# Patient Record
Sex: Female | Born: 1950 | Race: Black or African American | Hispanic: No | Marital: Married | Smoking: Never smoker
Health system: Southern US, Community
[De-identification: ages and names within clinical notes are randomized; demographics above are authoritative.]

## PROBLEM LIST (undated history)

## (undated) DIAGNOSIS — E785 Hyperlipidemia, unspecified: Secondary | ICD-10-CM

## (undated) DIAGNOSIS — C801 Malignant (primary) neoplasm, unspecified: Secondary | ICD-10-CM

## (undated) DIAGNOSIS — Z5189 Encounter for other specified aftercare: Secondary | ICD-10-CM

## (undated) DIAGNOSIS — D649 Anemia, unspecified: Secondary | ICD-10-CM

## (undated) DIAGNOSIS — I10 Essential (primary) hypertension: Secondary | ICD-10-CM

## (undated) DIAGNOSIS — T7840XA Allergy, unspecified, initial encounter: Secondary | ICD-10-CM

## (undated) HISTORY — DX: Malignant (primary) neoplasm, unspecified: C80.1

## (undated) HISTORY — DX: Hyperlipidemia, unspecified: E78.5

## (undated) HISTORY — DX: Allergy, unspecified, initial encounter: T78.40XA

## (undated) HISTORY — DX: Encounter for other specified aftercare: Z51.89

## (undated) HISTORY — DX: Anemia, unspecified: D64.9

## (undated) HISTORY — PX: ROTATOR CUFF REPAIR: SHX139

## (undated) HISTORY — PX: COLONOSCOPY: SHX174

---

## 1998-05-10 ENCOUNTER — Other Ambulatory Visit: Admission: RE | Admit: 1998-05-10 | Discharge: 1998-05-10 | Payer: Self-pay | Admitting: Obstetrics & Gynecology

## 2000-07-16 ENCOUNTER — Ambulatory Visit (HOSPITAL_COMMUNITY): Admission: RE | Admit: 2000-07-16 | Discharge: 2000-07-16 | Payer: Self-pay | Admitting: Obstetrics & Gynecology

## 2000-07-16 ENCOUNTER — Encounter: Payer: Self-pay | Admitting: Obstetrics & Gynecology

## 2002-02-12 ENCOUNTER — Encounter: Payer: Self-pay | Admitting: Obstetrics & Gynecology

## 2002-02-12 ENCOUNTER — Ambulatory Visit (HOSPITAL_COMMUNITY): Admission: RE | Admit: 2002-02-12 | Discharge: 2002-02-12 | Payer: Self-pay | Admitting: Obstetrics & Gynecology

## 2003-02-16 ENCOUNTER — Ambulatory Visit (HOSPITAL_COMMUNITY): Admission: RE | Admit: 2003-02-16 | Discharge: 2003-02-16 | Payer: Self-pay | Admitting: Obstetrics & Gynecology

## 2004-03-15 ENCOUNTER — Ambulatory Visit (HOSPITAL_COMMUNITY): Admission: RE | Admit: 2004-03-15 | Discharge: 2004-03-15 | Payer: Self-pay | Admitting: Internal Medicine

## 2005-03-28 ENCOUNTER — Encounter: Admission: RE | Admit: 2005-03-28 | Discharge: 2005-03-28 | Payer: Self-pay | Admitting: Internal Medicine

## 2006-02-15 ENCOUNTER — Emergency Department (HOSPITAL_COMMUNITY): Admission: EM | Admit: 2006-02-15 | Discharge: 2006-02-15 | Payer: Self-pay | Admitting: Emergency Medicine

## 2006-04-05 ENCOUNTER — Encounter
Admission: RE | Admit: 2006-04-05 | Discharge: 2006-04-05 | Payer: Self-pay | Admitting: Physical Medicine and Rehabilitation

## 2007-04-09 ENCOUNTER — Encounter: Admission: RE | Admit: 2007-04-09 | Discharge: 2007-04-09 | Payer: Self-pay | Admitting: Internal Medicine

## 2007-10-17 ENCOUNTER — Ambulatory Visit: Payer: Self-pay | Admitting: Internal Medicine

## 2007-10-24 ENCOUNTER — Telehealth: Payer: Self-pay | Admitting: Internal Medicine

## 2007-10-30 ENCOUNTER — Ambulatory Visit: Payer: Self-pay | Admitting: Internal Medicine

## 2008-05-11 ENCOUNTER — Encounter: Admission: RE | Admit: 2008-05-11 | Discharge: 2008-05-11 | Payer: Self-pay | Admitting: Internal Medicine

## 2009-03-12 DIAGNOSIS — C801 Malignant (primary) neoplasm, unspecified: Secondary | ICD-10-CM

## 2009-03-12 DIAGNOSIS — Z5189 Encounter for other specified aftercare: Secondary | ICD-10-CM

## 2009-03-12 HISTORY — DX: Malignant (primary) neoplasm, unspecified: C80.1

## 2009-03-12 HISTORY — DX: Encounter for other specified aftercare: Z51.89

## 2009-03-12 HISTORY — PX: ABDOMINAL HYSTERECTOMY: SHX81

## 2009-05-18 ENCOUNTER — Encounter: Admission: RE | Admit: 2009-05-18 | Discharge: 2009-05-18 | Payer: Self-pay | Admitting: Obstetrics and Gynecology

## 2009-06-03 ENCOUNTER — Ambulatory Visit: Admission: RE | Admit: 2009-06-03 | Discharge: 2009-06-03 | Payer: Self-pay | Admitting: Gynecology

## 2009-06-07 ENCOUNTER — Encounter: Payer: Self-pay | Admitting: Obstetrics & Gynecology

## 2009-06-07 ENCOUNTER — Inpatient Hospital Stay (HOSPITAL_COMMUNITY): Admission: RE | Admit: 2009-06-07 | Discharge: 2009-06-10 | Payer: Self-pay | Admitting: Gynecology

## 2009-06-15 ENCOUNTER — Ambulatory Visit: Admission: RE | Admit: 2009-06-15 | Discharge: 2009-06-15 | Payer: Self-pay | Admitting: Gynecology

## 2009-07-29 ENCOUNTER — Ambulatory Visit: Admission: RE | Admit: 2009-07-29 | Discharge: 2009-07-29 | Payer: Self-pay | Admitting: Gynecology

## 2010-04-01 ENCOUNTER — Encounter: Payer: Self-pay | Admitting: Obstetrics & Gynecology

## 2010-04-12 ENCOUNTER — Other Ambulatory Visit: Payer: Self-pay | Admitting: Obstetrics & Gynecology

## 2010-04-12 DIAGNOSIS — Z1239 Encounter for other screening for malignant neoplasm of breast: Secondary | ICD-10-CM

## 2010-05-22 ENCOUNTER — Ambulatory Visit
Admission: RE | Admit: 2010-05-22 | Discharge: 2010-05-22 | Disposition: A | Discharge status: Home / Self Care | Payer: Self-pay | Source: Ambulatory Visit | Attending: Obstetrics & Gynecology | Admitting: Obstetrics & Gynecology

## 2010-05-22 DIAGNOSIS — Z1239 Encounter for other screening for malignant neoplasm of breast: Secondary | ICD-10-CM

## 2010-06-05 LAB — DIFFERENTIAL
Eosinophils Absolute: 0 10*3/uL (ref 0.0–0.7)
Eosinophils Relative: 1 % (ref 0–5)
Monocytes Absolute: 0.3 10*3/uL (ref 0.1–1.0)
Monocytes Relative: 8 % (ref 3–12)
Neutrophils Relative %: 46 % (ref 43–77)

## 2010-06-05 LAB — URINALYSIS, ROUTINE W REFLEX MICROSCOPIC
Nitrite: NEGATIVE
Urobilinogen, UA: 0.2 mg/dL (ref 0.0–1.0)

## 2010-06-05 LAB — BASIC METABOLIC PANEL
Calcium: 8 mg/dL — ABNORMAL LOW (ref 8.4–10.5)
GFR calc Af Amer: >60 mL/min (ref >60)
Potassium: 4.1 mEq/L (ref 3.5–5.1)

## 2010-06-05 LAB — COMPREHENSIVE METABOLIC PANEL
Alkaline Phosphatase: 70 U/L (ref 39–117)
BUN: 13 mg/dL (ref 6–23)
Chloride: 105 mEq/L (ref 96–112)
Creatinine, Ser: 0.92 mg/dL (ref 0.4–1.2)
GFR calc Af Amer: >60 mL/min (ref >60)
Sodium: 143 mEq/L (ref 135–145)
Total Bilirubin: 0.5 mg/dL (ref 0.3–1.2)

## 2010-06-05 LAB — CBC
HCT: 35.2 % — ABNORMAL LOW (ref 36.0–46.0)
Hemoglobin: 11.3 g/dL — ABNORMAL LOW (ref 12.0–15.0)
MCHC: 32.1 g/dL (ref 30.0–36.0)
MCHC: 32.9 g/dL (ref 30.0–36.0)
Platelets: 171 10*3/uL (ref 150–400)
RBC: 4.06 MIL/uL (ref 3.87–5.11)
RDW: 12.2 % (ref 11.5–15.5)
RDW: 12.7 % (ref 11.5–15.5)
WBC: 4.2 10*3/uL (ref 4.0–10.5)
WBC: 7.7 10*3/uL (ref 4.0–10.5)

## 2010-06-05 LAB — URINE MICROSCOPIC-ADD ON

## 2010-06-05 LAB — URINE CULTURE: Colony Count: 85000

## 2010-06-05 LAB — TYPE AND SCREEN: Antibody Screen: NEGATIVE

## 2010-11-26 ENCOUNTER — Emergency Department (HOSPITAL_COMMUNITY)
Admission: EM | Admit: 2010-11-26 | Discharge: 2010-11-26 | Disposition: A | Discharge status: Home / Self Care | Payer: BC Managed Care – PPO | Attending: Emergency Medicine | Admitting: Emergency Medicine

## 2010-11-26 DIAGNOSIS — I10 Essential (primary) hypertension: Secondary | ICD-10-CM | POA: Insufficient documentation

## 2010-11-26 DIAGNOSIS — T63481A Toxic effect of venom of other arthropod, accidental (unintentional), initial encounter: Secondary | ICD-10-CM | POA: Insufficient documentation

## 2010-11-26 DIAGNOSIS — T6391XA Toxic effect of contact with unspecified venomous animal, accidental (unintentional), initial encounter: Secondary | ICD-10-CM | POA: Insufficient documentation

## 2010-11-26 DIAGNOSIS — L989 Disorder of the skin and subcutaneous tissue, unspecified: Secondary | ICD-10-CM | POA: Insufficient documentation

## 2011-01-09 ENCOUNTER — Other Ambulatory Visit: Payer: Self-pay | Admitting: Obstetrics & Gynecology

## 2011-04-06 ENCOUNTER — Emergency Department (HOSPITAL_COMMUNITY)
Admission: EM | Admit: 2011-04-06 | Discharge: 2011-04-06 | Disposition: A | Discharge status: Home / Self Care | Payer: No Typology Code available for payment source | Attending: Emergency Medicine | Admitting: Emergency Medicine

## 2011-04-06 ENCOUNTER — Encounter (HOSPITAL_COMMUNITY): Payer: Self-pay | Admitting: Emergency Medicine

## 2011-04-06 ENCOUNTER — Emergency Department (HOSPITAL_COMMUNITY): Payer: No Typology Code available for payment source

## 2011-04-06 DIAGNOSIS — S161XXA Strain of muscle, fascia and tendon at neck level, initial encounter: Secondary | ICD-10-CM

## 2011-04-06 DIAGNOSIS — I10 Essential (primary) hypertension: Secondary | ICD-10-CM | POA: Insufficient documentation

## 2011-04-06 DIAGNOSIS — S139XXA Sprain of joints and ligaments of unspecified parts of neck, initial encounter: Secondary | ICD-10-CM | POA: Insufficient documentation

## 2011-04-06 DIAGNOSIS — M62838 Other muscle spasm: Secondary | ICD-10-CM | POA: Insufficient documentation

## 2011-04-06 DIAGNOSIS — R51 Headache: Secondary | ICD-10-CM | POA: Insufficient documentation

## 2011-04-06 DIAGNOSIS — M542 Cervicalgia: Secondary | ICD-10-CM | POA: Insufficient documentation

## 2011-04-06 HISTORY — DX: Essential (primary) hypertension: I10

## 2011-04-06 MED ORDER — FENTANYL CITRATE 0.05 MG/ML IJ SOLN
100.0000 ug | Freq: Once | INTRAMUSCULAR | Status: DC
  Filled 2011-04-06: qty 2

## 2011-04-06 MED ORDER — IBUPROFEN 800 MG PO TABS: 800.0000 mg | ORAL_TABLET | Freq: Three times a day (TID) | ORAL | Status: AC

## 2011-04-06 MED ORDER — HYDROCODONE-ACETAMINOPHEN 5-500 MG PO TABS: 1.0000 | ORAL_TABLET | Freq: Four times a day (QID) | ORAL | Status: AC | PRN

## 2011-04-06 MED ORDER — METHOCARBAMOL 500 MG PO TABS: 500.0000 mg | ORAL_TABLET | Freq: Two times a day (BID) | ORAL | Status: AC

## 2011-04-06 MED ORDER — ONDANSETRON 4 MG PO TBDP
4.0000 mg | ORAL_TABLET | Freq: Once | ORAL | Status: DC
  Filled 2011-04-06: qty 1

## 2011-04-06 MED ORDER — IBUPROFEN 800 MG PO TABS
800.0000 mg | ORAL_TABLET | Freq: Once | ORAL | Status: AC
  Administered 2011-04-06: 800 mg via ORAL
  Filled 2011-04-06: qty 1

## 2011-04-06 MED ORDER — OXYCODONE-ACETAMINOPHEN 5-325 MG PO TABS
1.0000 | ORAL_TABLET | Freq: Once | ORAL | Status: AC
  Administered 2011-04-06: 1 via ORAL
  Filled 2011-04-06: qty 1
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
## 2011-04-06 NOTE — ED Notes (Signed)
Patient transported to X-ray 
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
## 2011-04-09 NOTE — ED Provider Notes (Signed)
Medical screening examination/treatment/procedure(s) were performed by non-physician practitioner and as supervising physician I was immediately available for consultation/collaboration.  Nicholes Stairs, MD 04/09/11 [phone]

## 2011-04-25 ENCOUNTER — Other Ambulatory Visit: Payer: Self-pay | Admitting: Obstetrics & Gynecology

## 2011-04-25 DIAGNOSIS — Z1231 Encounter for screening mammogram for malignant neoplasm of breast: Secondary | ICD-10-CM

## 2011-05-23 ENCOUNTER — Ambulatory Visit
Admission: RE | Admit: 2011-05-23 | Discharge: 2011-05-23 | Disposition: A | Discharge status: Home / Self Care | Payer: BC Managed Care – PPO | Source: Ambulatory Visit | Attending: Obstetrics & Gynecology | Admitting: Obstetrics & Gynecology

## 2011-05-23 DIAGNOSIS — Z1231 Encounter for screening mammogram for malignant neoplasm of breast: Secondary | ICD-10-CM

## 2013-06-02 ENCOUNTER — Other Ambulatory Visit: Payer: Self-pay | Admitting: Obstetrics & Gynecology

## 2013-07-06 ENCOUNTER — Other Ambulatory Visit: Payer: Self-pay | Admitting: *Deleted

## 2013-07-06 DIAGNOSIS — M7989 Other specified soft tissue disorders: Secondary | ICD-10-CM

## 2013-07-22 ENCOUNTER — Encounter: Payer: Self-pay | Admitting: Vascular Surgery

## 2013-07-23 ENCOUNTER — Encounter: Payer: Self-pay | Admitting: Vascular Surgery

## 2013-07-23 ENCOUNTER — Ambulatory Visit (INDEPENDENT_AMBULATORY_CARE_PROVIDER_SITE_OTHER): Payer: BC Managed Care – HMO | Admitting: Vascular Surgery

## 2013-07-23 ENCOUNTER — Ambulatory Visit (HOSPITAL_COMMUNITY)
Admission: RE | Admit: 2013-07-23 | Discharge: 2013-07-23 | Disposition: A | Discharge status: Home / Self Care | Payer: BC Managed Care – HMO | Source: Ambulatory Visit | Attending: Vascular Surgery | Admitting: Vascular Surgery

## 2013-07-23 VITALS — BP 152/89 | HR 64 | Ht 59.0 in | Wt 140.6 lb

## 2013-07-23 DIAGNOSIS — M7989 Other specified soft tissue disorders: Secondary | ICD-10-CM
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
## 2014-05-31 ENCOUNTER — Other Ambulatory Visit: Payer: Self-pay | Admitting: Internal Medicine

## 2014-05-31 DIAGNOSIS — Z1231 Encounter for screening mammogram for malignant neoplasm of breast: Secondary | ICD-10-CM

## 2014-06-08 ENCOUNTER — Ambulatory Visit: Payer: Medicare Other

## 2014-10-05 ENCOUNTER — Encounter: Payer: Self-pay | Admitting: Internal Medicine

## 2015-02-07 ENCOUNTER — Ambulatory Visit: Payer: Commercial Managed Care - HMO | Admitting: Podiatry

## 2015-06-16 ENCOUNTER — Other Ambulatory Visit: Payer: Self-pay

## 2015-06-16 DIAGNOSIS — Z1231 Encounter for screening mammogram for malignant neoplasm of breast: Secondary | ICD-10-CM

## 2015-07-07 ENCOUNTER — Ambulatory Visit: Payer: Medicare Other

## 2015-07-18 ENCOUNTER — Other Ambulatory Visit: Payer: Self-pay | Admitting: Obstetrics & Gynecology

## 2015-07-19 LAB — CYTOLOGY - PAP

## 2015-07-25 ENCOUNTER — Ambulatory Visit
Admission: RE | Admit: 2015-07-25 | Discharge: 2015-07-25 | Disposition: A | Discharge status: Home / Self Care | Payer: Medicare Other | Source: Ambulatory Visit

## 2015-07-25 DIAGNOSIS — Z1231 Encounter for screening mammogram for malignant neoplasm of breast: Secondary | ICD-10-CM

## 2016-06-12 ENCOUNTER — Other Ambulatory Visit: Payer: Self-pay | Admitting: Internal Medicine

## 2016-06-12 DIAGNOSIS — Z1231 Encounter for screening mammogram for malignant neoplasm of breast: Secondary | ICD-10-CM

## 2016-07-25 ENCOUNTER — Ambulatory Visit
Admission: RE | Admit: 2016-07-25 | Discharge: 2016-07-25 | Disposition: A | Discharge status: Home / Self Care | Payer: Medicare Other | Source: Ambulatory Visit | Attending: Internal Medicine | Admitting: Internal Medicine

## 2016-07-25 DIAGNOSIS — Z1231 Encounter for screening mammogram for malignant neoplasm of breast: Secondary | ICD-10-CM

## 2017-10-17 ENCOUNTER — Encounter: Payer: Self-pay | Admitting: Gastroenterology

## 2017-10-23 ENCOUNTER — Encounter: Payer: Self-pay | Admitting: Gastroenterology

## 2017-12-05 ENCOUNTER — Encounter: Payer: Self-pay | Admitting: Gastroenterology

## 2017-12-05 ENCOUNTER — Ambulatory Visit (AMBULATORY_SURGERY_CENTER): Payer: Self-pay

## 2017-12-05 VITALS — Ht 59.0 in | Wt 145.6 lb

## 2017-12-05 DIAGNOSIS — Z1211 Encounter for screening for malignant neoplasm of colon: Secondary | ICD-10-CM

## 2017-12-05 MED ORDER — PEG-KCL-NACL-NASULF-NA ASC-C 140 G PO SOLR: 1.0000 | Freq: Once | ORAL | Status: AC

## 2017-12-05 NOTE — Progress Notes (Signed)
Per pt, no allergies to soy or egg products.Pt not taking any weight loss meds or using  O2 at home.  Pt refused emmi video. 

## 2017-12-19 ENCOUNTER — Ambulatory Visit (AMBULATORY_SURGERY_CENTER): Payer: Medicare Other | Admitting: Gastroenterology

## 2017-12-19 ENCOUNTER — Encounter: Payer: Self-pay | Admitting: Gastroenterology

## 2017-12-19 VITALS — BP 144/75 | HR 62 | Temp 98.9°F | Resp 16 | Ht 59.0 in | Wt 145.0 lb

## 2017-12-19 DIAGNOSIS — Z1211 Encounter for screening for malignant neoplasm of colon: Secondary | ICD-10-CM

## 2017-12-19 MED ORDER — SODIUM CHLORIDE 0.9 % IV SOLN: 500.0000 mL | Freq: Once | INTRAVENOUS | Status: DC

## 2017-12-19 NOTE — Progress Notes (Signed)
Report given to PACU, vss 

## 2017-12-19 NOTE — Progress Notes (Signed)
Pt's states no medical or surgical changes since previsit or office visit. 

## 2017-12-19 NOTE — Op Note (Addendum)
[place] Patient Name: Emily Adkins Procedure Date: 12/19/2017 11:01 AM MRN: [id number] Endoscopist: Mallie Mussel L. Loletha Carrow , MD Age: 67 Referring MD:  Date of Birth: Jul 28, 1950 Gender: Female Account #: [id number] Procedure:                Colonoscopy Indications:              Screening for colorectal malignant neoplasm (normal                            colonoscopy 10/2007) Medicines:                Monitored Anesthesia Care Procedure:                Pre-Anesthesia Assessment:                           - Prior to the procedure, a History and Physical                            was performed, and patient medications and                            allergies were reviewed. The patient's tolerance of                            previous anesthesia was also reviewed. The risks                            and benefits of the procedure and the sedation                            options and risks were discussed with the patient.                            All questions were answered, and informed consent                            was obtained. Prior Anticoagulants: The patient has                            taken no previous anticoagulant or antiplatelet                            agents. ASA Grade Assessment: II - A patient with                            mild systemic disease. After reviewing the risks                            and benefits, the patient was deemed in                            satisfactory condition to undergo the procedure.  After obtaining informed consent, the colonoscope                            was passed under direct vision. Throughout the                            procedure, the patient's blood pressure, pulse, and                            oxygen saturations were monitored continuously. The                            Colonoscope was introduced through the anus and                            advanced to the the cecum, identified by                             appendiceal orifice and ileocecal valve. The                            colonoscopy was performed with difficulty due to                            restricted mobility of the colon and significant                            looping. Successful completion of the procedure was                            aided by changing the patient to a supine position                            and using manual pressure. The patient tolerated                            the procedure well. The quality of the bowel                            preparation was excellent. Scope In: 11:12:27 AM Scope Out: 11:32:35 AM Scope Withdrawal Time: 0 hours 7 minutes 26 seconds  Total Procedure Duration: 0 hours 20 minutes 8 seconds  Findings:                 The perianal and digital rectal examinations were                            normal.                           The sigmoid colon was significantly tortuous at one                            point, with a tight angulation that felt fixed in  position (likely owing to a prior surgery). It was                            passed, and the remainder of the colon examined.                           An area of melanosis was found in the entire colon.                           Retroflexion in the rectum was not performed due to                            anatomy. Complications:            No immediate complications. Estimated Blood Loss:     Estimated blood loss: none. Impression:               - Tortuous colon.                           - Melanosis in the colon.                           - No specimens collected. Recommendation:           - Patient has a contact number available for                            emergencies. The signs and symptoms of potential                            delayed complications were discussed with the                            patient. Return to normal activities tomorrow.                             Written discharge instructions were provided to the                            patient.                           - Resume previous diet.                           - Continue present medications.                           - No repeat screening colonoscopy due to age. Henry L. Loletha Carrow, MD 12/19/2017 11:38:58 AM This report has been signed electronically.

## 2017-12-19 NOTE — Patient Instructions (Signed)
YOU HAD AN ENDOSCOPIC PROCEDURE TODAY AT [facility]:   Refer to the procedure report that was given to you for any specific questions about what was found during the examination.  If the procedure report does not answer your questions, please call your gastroenterologist to clarify.  If you requested that your care partner not be given the details of your procedure findings, then the procedure report has been included in a sealed envelope for you to review at your convenience later.  YOU SHOULD EXPECT: Some feelings of bloating in the abdomen. Passage of more gas than usual.  Walking can help get rid of the air that was put into your GI tract during the procedure and reduce the bloating. If you had a lower endoscopy (such as a colonoscopy or flexible sigmoidoscopy) you may notice spotting of blood in your stool or on the toilet paper. If you underwent a bowel prep for your procedure, you may not have a normal bowel movement for a few days.  Please Note:  You might notice some irritation and congestion in your nose or some drainage.  This is from the oxygen used during your procedure.  There is no need for concern and it should clear up in a day or so.  SYMPTOMS TO REPORT IMMEDIATELY:   Following lower endoscopy (colonoscopy or flexible sigmoidoscopy):  Excessive amounts of blood in the stool  Significant tenderness or worsening of abdominal pains  Swelling of the abdomen that is new, acute  Fever of 100F or higher   For urgent or emergent issues, a gastroenterologist can be reached at any hour by calling [phone].   DIET:  We do recommend a small meal at first, but then you may proceed to your regular diet.  Drink plenty of fluids but you should avoid alcoholic beverages for 24 hours.  ACTIVITY:  You should plan to take it easy for the rest of today and you should NOT DRIVE or use heavy machinery until tomorrow (because of the sedation medicines used during the test).     FOLLOW UP: Our staff will call the number listed on your records the next business day following your procedure to check on you and address any questions or concerns that you may have regarding the information given to you following your procedure. If we do not reach you, we will leave a message.  However, if you are feeling well and you are not experiencing any problems, there is no need to return our call.  We will assume that you have returned to your regular daily activities without incident.    SIGNATURES/CONFIDENTIALITY: You and/or your care partner have signed paperwork which will be entered into your electronic medical record.  These signatures attest to the fact that that the information above on your After Visit Summary has been reviewed and is understood.  Full responsibility of the confidentiality of this discharge information lies with you and/or your care-partner.

## 2017-12-20 ENCOUNTER — Telehealth: Payer: Self-pay

## 2017-12-20 NOTE — Telephone Encounter (Signed)
  Follow up Call-  Call back number 12/19/2017  Post procedure Call Back phone  # [phone]  Permission to leave phone message Yes  Some recent data might be hidden     Patient questions:  Do you have a fever, pain , or abdominal swelling? No. Pain Score  0 *  Have you tolerated food without any problems? Yes.    Have you been able to return to your normal activities? Yes.    Do you have any questions about your discharge instructions: Diet   No. Medications  No. Follow up visit  No.  Do you have questions or concerns about your Care? No.  Actions: * If pain score is 4 or above: No action needed, pain <4.

## 2019-04-18 ENCOUNTER — Other Ambulatory Visit: Payer: Self-pay

## 2019-04-18 ENCOUNTER — Ambulatory Visit
Admission: EM | Admit: 2019-04-18 | Discharge: 2019-04-18 | Disposition: A | Discharge status: Home / Self Care | Payer: Medicare Other

## 2019-04-18 NOTE — ED Provider Notes (Signed)
69 year old female came in for COVID testing due to exposure. States had one exposure 1 week ago, then yesterday. She is asymptomatic. Discussed testing indications and interpretations with patient and son, where test would not be accurate for exposure yesterday. Also, would still require monitoring for 14 days since last exposure. Patient decides to postpone testing to 5 days since last exposure, and will return for testing. She understands that even then, will still need monitoring for 14 days.    Ok Edwards, PA-C 04/18/19 1147

## 2019-04-25 ENCOUNTER — Other Ambulatory Visit: Payer: Self-pay

## 2019-04-25 ENCOUNTER — Encounter: Payer: Self-pay | Admitting: Emergency Medicine

## 2019-04-25 ENCOUNTER — Ambulatory Visit
Admission: EM | Admit: 2019-04-25 | Discharge: 2019-04-25 | Disposition: A | Discharge status: Home / Self Care | Payer: Medicare Other | Attending: Emergency Medicine | Admitting: Emergency Medicine

## 2019-04-25 DIAGNOSIS — Z20822 Contact with and (suspected) exposure to covid-19: Secondary | ICD-10-CM | POA: Diagnosis not present

## 2019-04-25 DIAGNOSIS — I10 Essential (primary) hypertension: Secondary | ICD-10-CM

## 2019-04-25 NOTE — ED Triage Notes (Addendum)
Patient states her spouse was diagnosed with COVID x 2 weeks ago and has been hospitalized for the duration.  She has quarantined for 14 days and is requesting a COVID test prior to husband's hospital d/c. She reports being asymptomatic and notes having the COVID vaccine one week ago.

## 2019-04-25 NOTE — ED Provider Notes (Signed)
[facility]    CSN: [id number] Arrival date & time: 04/25/19  1026      History   Chief Complaint Chief Complaint  Patient presents with  . COVID Test    Asymptomatic    HPI Kati Shafer is a 69 y.o. female w/ h/o allergies, HTN, cervical CA  Presenting for Covid testing: Exposure: family member Date of exposure: 04/17/19 Any fever, symptoms since exposure: no No additional questions regarding testing at this time.    Past Medical History:  Diagnosis Date  . Allergy   . Anemia    in past  . Blood transfusion without reported diagnosis 2011   bleeding related to cervical cancer surgery  . Cancer [facility]) 2011   cervical cancer  . Hyperlipidemia   . Hypertension     Patient Active Problem List   Diagnosis Date Noted  . Swelling of limb 07/23/2013    Past Surgical History:  Procedure Laterality Date  . ABDOMINAL HYSTERECTOMY  2011  . COLONOSCOPY    . ROTATOR CUFF REPAIR Right     OB History   No obstetric history on file.      Home Medications    Prior to Admission medications   Medication Sig Start Date End Date Taking? Authorizing Provider  benazepril-hydrochlorthiazide (LOTENSIN HCT) 20-25 MG per tablet Take 1 tablet by mouth daily.    [provider]  Calcium Carbonate-Vitamin D (CALCIUM + D PO) Take 1 tablet by mouth 2 (two) times daily.    [provider]  Multiple Vitamin (MULITIVITAMIN WITH MINERALS) TABS Take 1 tablet by mouth daily.    [provider]  potassium chloride (K-DUR,KLOR-CON) 10 MEQ tablet Take 10 mEq by mouth 2 (two) times daily.    [provider]  pravastatin (PRAVACHOL) 40 MG tablet Take 40 mg by mouth daily.    [provider]    Family History Family History  Problem Relation Age of Onset  . Hyperlipidemia Mother   . Hypertension Mother   . Hyperlipidemia Father   . Hypertension Father   . Hyperlipidemia Sister   . Hypertension Sister   . Drug abuse Sister   .  Hyperlipidemia Brother   . Hypertension Brother   . Hyperlipidemia Son   . Hypertension Son   . Heart disease Brother   . Heart disease Brother   . Alcohol abuse Brother   . Drug abuse Brother   . Colon cancer Neg Hx   . Esophageal cancer Neg Hx   . Rectal cancer Neg Hx   . Stomach cancer Neg Hx     Social History Social History   Tobacco Use  . Smoking status: Never Smoker  . Smokeless tobacco: Never Used  Substance Use Topics  . Alcohol use: No  . Drug use: No     Allergies   Naproxen   Review of Systems Review of Systems  Constitutional: Negative for fatigue and fever.  HENT: Negative for ear pain, sinus pain, sore throat and voice change.   Eyes: Negative for pain, redness and visual disturbance.  Respiratory: Negative for cough and shortness of breath.   Cardiovascular: Negative for chest pain and palpitations.  Gastrointestinal: Negative for abdominal pain, diarrhea and vomiting.  Musculoskeletal: Negative for arthralgias and myalgias.  Skin: Negative for rash and wound.  Neurological: Negative for syncope and headaches.     Physical Exam Triage Vital Signs ED Triage Vitals  Enc Vitals Group     BP  Pulse      Resp      Temp      Temp src      SpO2      Weight      Height      Head Circumference      Peak Flow      Pain Score      Pain Loc      Pain Edu?      Excl. in [place]?    No data found.  Updated Vital Signs BP (!) 177/87 (BP Location: Left Arm)   Pulse 94   Temp 98.4 F (36.9 C) (Oral)   Resp 17   SpO2 98%   Visual Acuity Right Eye Distance:   Left Eye Distance:   Bilateral Distance:    Right Eye Near:   Left Eye Near:    Bilateral Near:     Physical Exam Constitutional:      General: She is not in acute distress. HENT:     Head: Normocephalic and atraumatic.  Eyes:     General: No scleral icterus.    Pupils: Pupils are equal, round, and reactive to light.  Cardiovascular:     Rate and Rhythm: Normal rate.    Pulmonary:     Effort: Pulmonary effort is normal.  Skin:    Coloration: Skin is not jaundiced or pale.  Neurological:     Mental Status: She is alert and oriented to person, place, and time.      UC Treatments / Results  Labs (all labs ordered are listed, but only abnormal results are displayed) Labs Reviewed  NOVEL CORONAVIRUS, NAA    EKG   Radiology No results found.  Procedures Procedures (including critical care time)  Medications Ordered in UC Medications - No data to display  Initial Impression / Assessment and Plan / UC Course  I have reviewed the triage vital signs and the nursing notes.  Pertinent labs & imaging results that were available during my care of the patient were reviewed by me and considered in my medical decision making (see chart for details).     Patient afebrile, nontoxic, with SpO2 98%.  Hypertensive; asymptomatic & took BP right before leaving to come to UC this AM.  Discussed low risk of acute infection given duration & asymptomatic since exposure: requesting testing for reassurance.  Covid PCR pending.  Patient to quarantine until results are back.  We will continue supportive management.  Return precautions discussed, patient verbalized understanding and is agreeable to plan. Final Clinical Impressions(s) / UC Diagnoses   Final diagnoses:  Exposure to COVID-19 virus     Discharge Instructions     Your COVID test is pending - it is important to quarantine / isolate at home until your results are back. If you test positive and would like further evaluation for persistent or worsening symptoms, you may schedule an E-visit or virtual (video) visit throughout the [facility] app or website.  PLEASE NOTE: If you develop severe chest pain or shortness of breath please go to the ER or call 9-1-1 for further evaluation --> DO NOT schedule electronic or virtual visits for this. Please call our office for further guidance /  recommendations as needed.  For information about the Covid vaccine, please visit [email or web address]    ED Prescriptions    None     PDMP not reviewed this encounter.   [place], [place], [place] 04/25/19 1124

## 2019-04-25 NOTE — Discharge Instructions (Signed)
Your COVID test is pending - it is important to quarantine / isolate at home until your results are back. °If you test positive and would like further evaluation for persistent or worsening symptoms, you may schedule an E-visit or virtual (video) visit throughout the [facility] MyChart app or website. ° °PLEASE NOTE: If you develop severe chest pain or shortness of breath please go to the ER or call 9-1-1 for further evaluation --> DO NOT schedule electronic or virtual visits for this. °Please call our office for further guidance / recommendations as needed. ° °For information about the Covid vaccine, please visit [email or web address] °

## 2019-04-26 LAB — NOVEL CORONAVIRUS, NAA: SARS-CoV-2, NAA: NOT DETECTED

## 2020-10-18 ENCOUNTER — Other Ambulatory Visit: Payer: Self-pay | Admitting: Obstetrics & Gynecology

## 2020-10-18 DIAGNOSIS — Z1231 Encounter for screening mammogram for malignant neoplasm of breast: Secondary | ICD-10-CM

## 2020-10-24 ENCOUNTER — Other Ambulatory Visit: Payer: Self-pay

## 2020-10-24 ENCOUNTER — Ambulatory Visit
Admission: RE | Admit: 2020-10-24 | Discharge: 2020-10-24 | Disposition: A | Discharge status: Home / Self Care | Payer: Medicare Other | Source: Ambulatory Visit | Attending: Obstetrics & Gynecology | Admitting: Obstetrics & Gynecology

## 2020-10-24 DIAGNOSIS — Z1231 Encounter for screening mammogram for malignant neoplasm of breast: Secondary | ICD-10-CM

## 2021-12-30 IMAGING — MG MM DIGITAL SCREENING BILAT W/ TOMO AND CAD
8 series · 8 of 24 positions shown · non-contrast
Comparison: Previous exam(s).

CLINICAL DATA: Screening.

EXAM:
DIGITAL SCREENING BILATERAL MAMMOGRAM WITH TOMOSYNTHESIS AND CAD
TECHNIQUE: Bilateral screening digital craniocaudal and mediolateral oblique
mammograms were obtained. Bilateral screening digital breast
tomosynthesis was performed. The images were evaluated with
computer-aided detection.

[L MLO synth-2D]
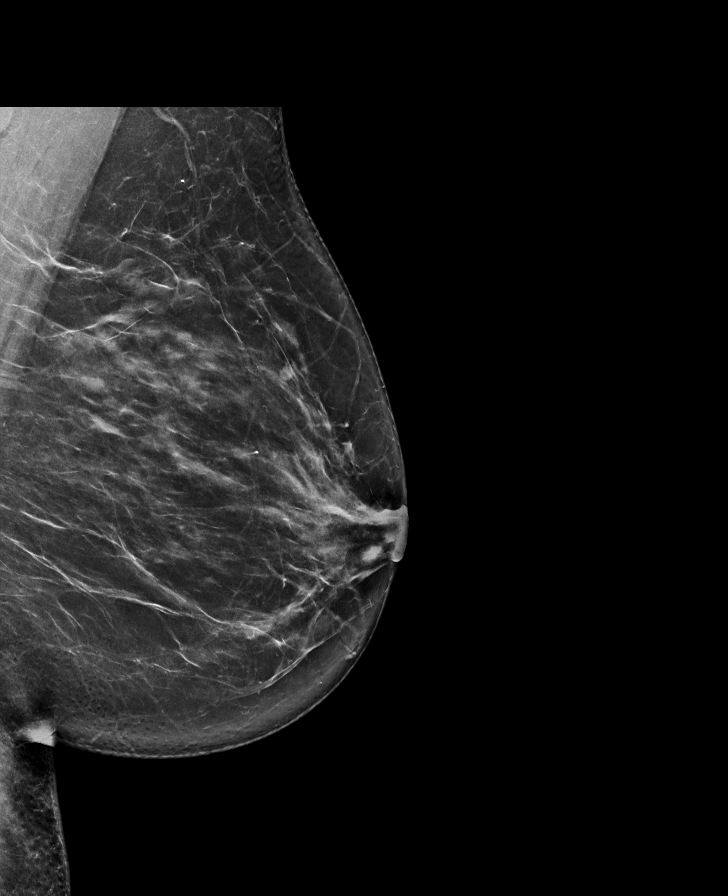

[L CC synth-2D]
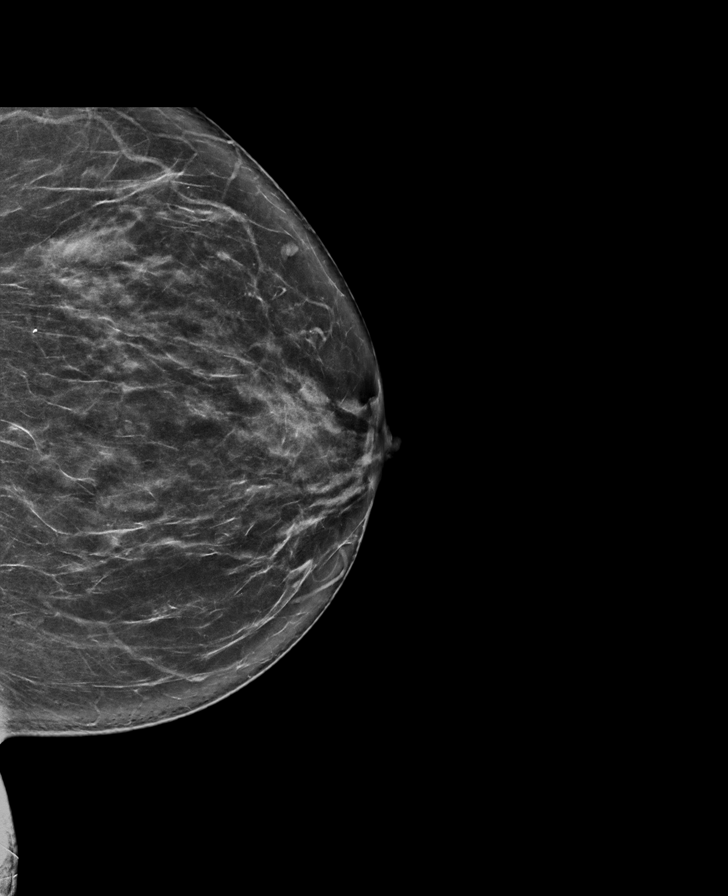

[R CC synth-2D]
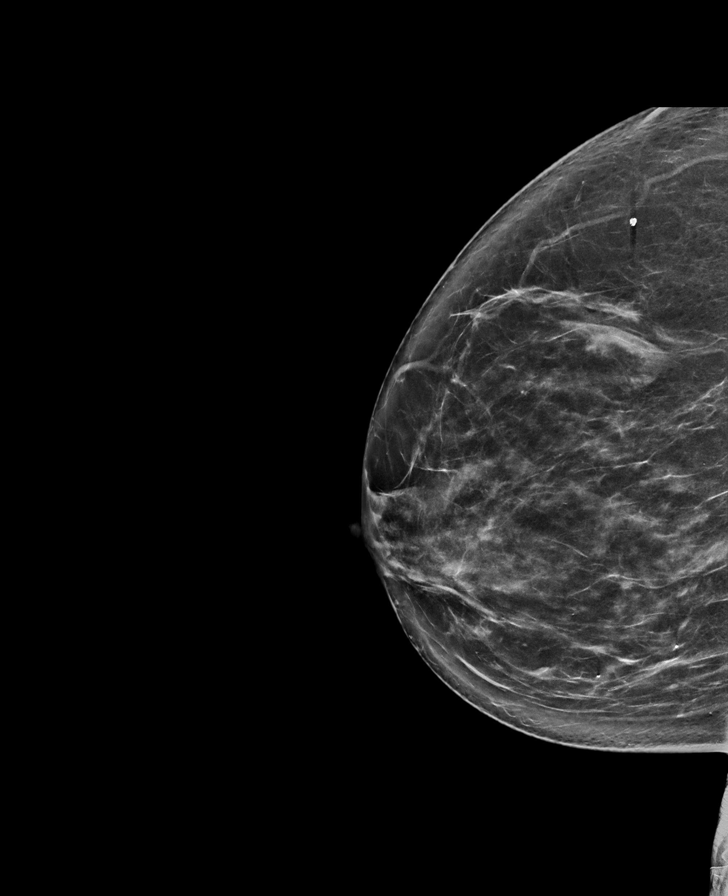

[R MLO synth-2D]
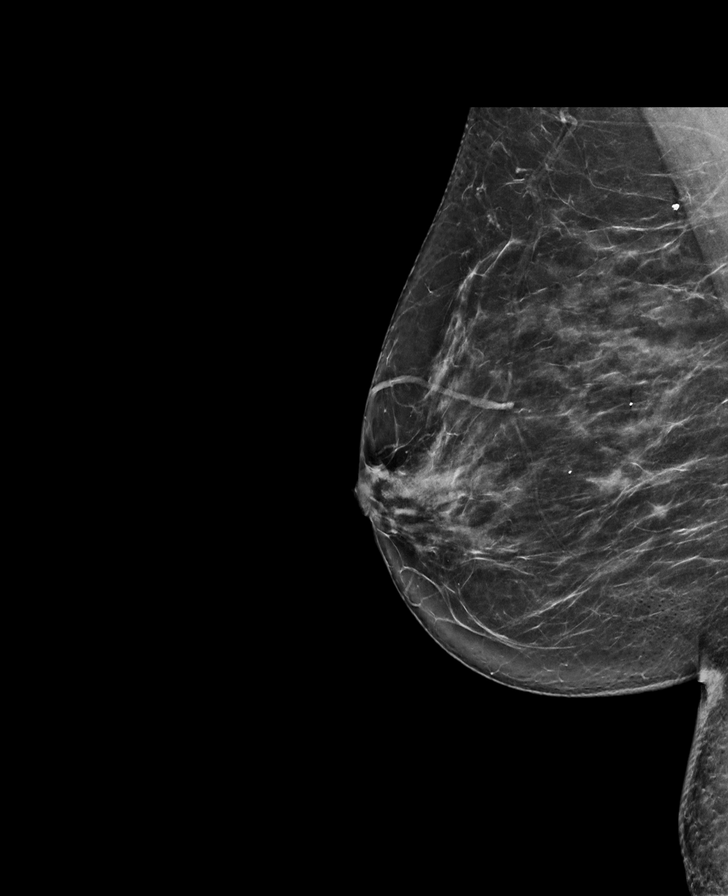

[R MLO tomo · tomo slice 40/79.0]
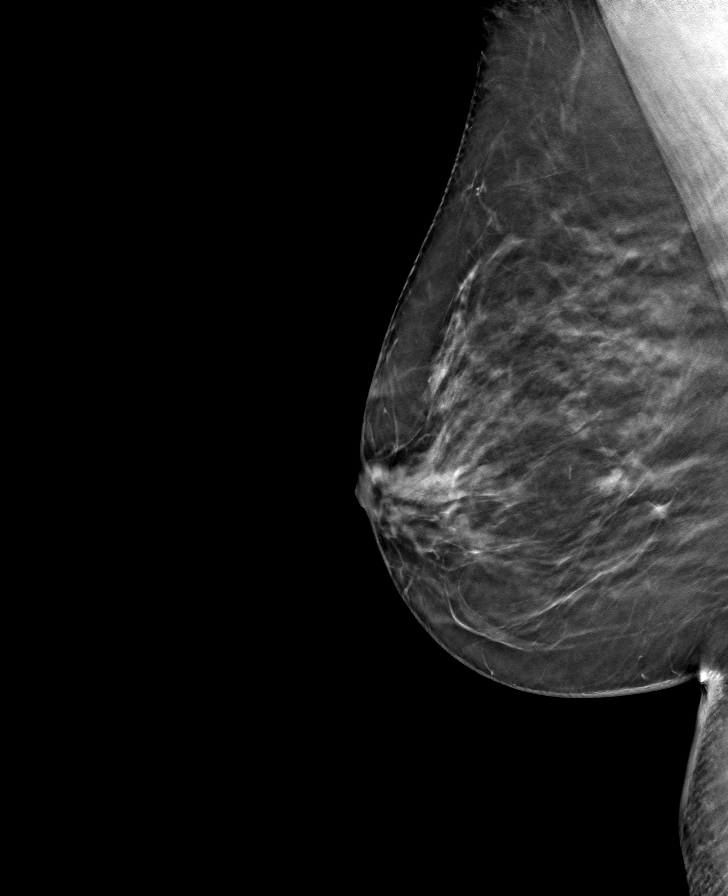

[L CC tomo · tomo slice 41/81.0]
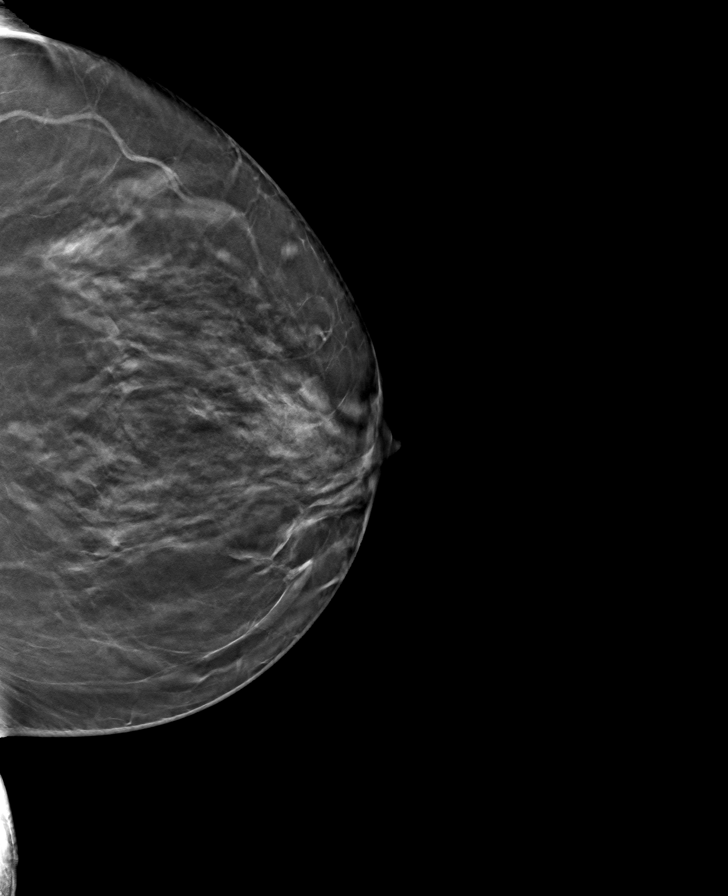

[R CC tomo · tomo slice 41/80.0]
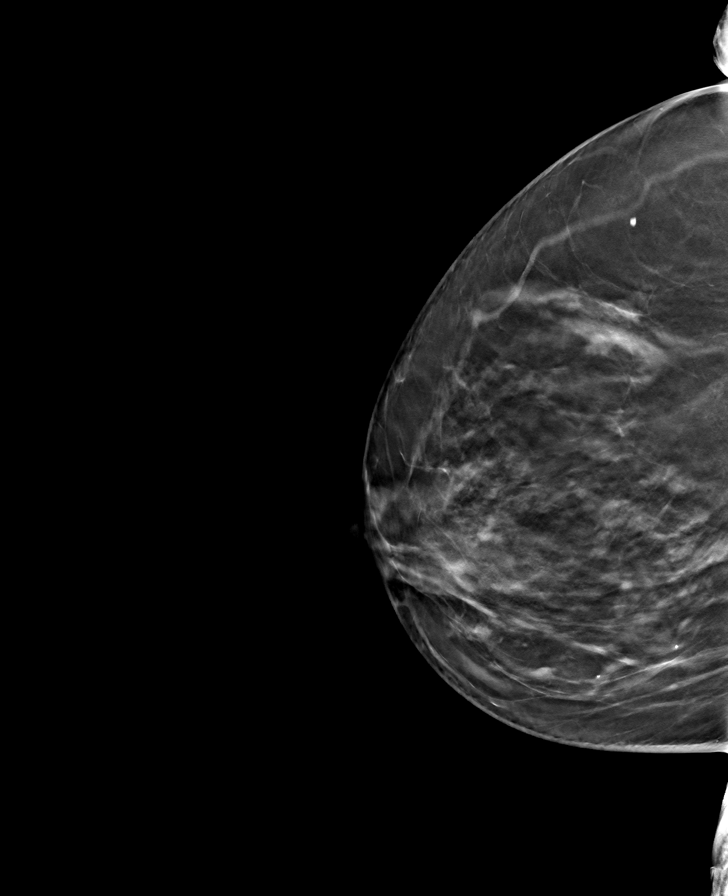

[L MLO tomo · tomo slice 43/86.0]
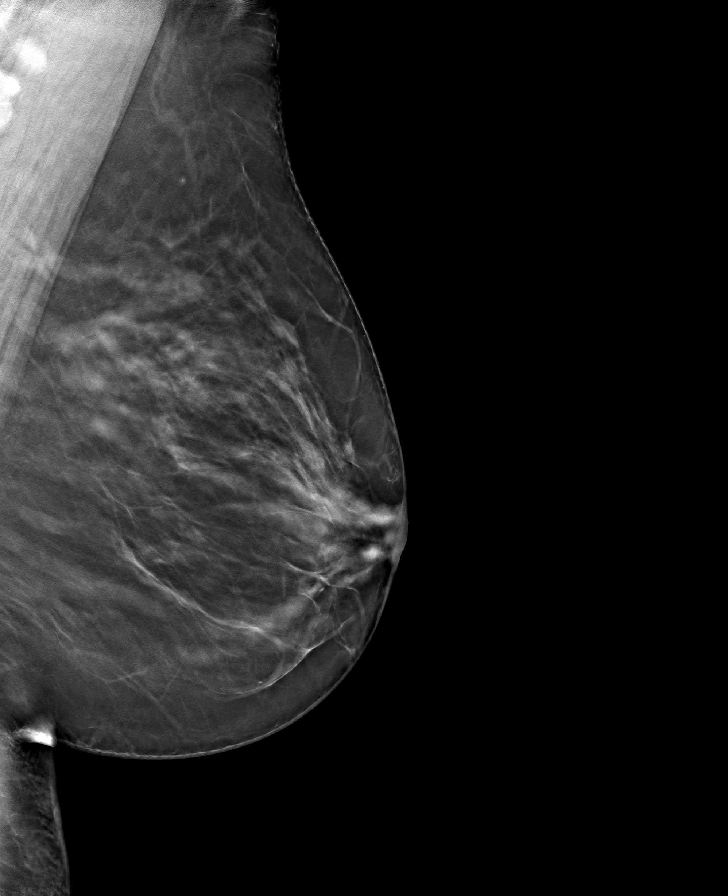

[8 of 24 positions shown; findings below may reference images not displayed]

ACR Breast Density Category c: The breast tissue is heterogeneously
dense, which may obscure small masses.
FINDINGS: There are no findings suspicious for malignancy.
IMPRESSION: No mammographic evidence of malignancy. A result letter of this
screening mammogram will be mailed directly to the patient.

RECOMMENDATION:
Screening mammogram in one year. (Code:[id number])

BI-RADS CATEGORY  1: Negative.

## 2022-03-28 ENCOUNTER — Emergency Department (HOSPITAL_COMMUNITY): Payer: 59

## 2022-03-28 ENCOUNTER — Other Ambulatory Visit: Payer: Self-pay

## 2022-03-28 ENCOUNTER — Emergency Department (HOSPITAL_COMMUNITY)
Admission: EM | Admit: 2022-03-28 | Discharge: 2022-03-28 | Disposition: A | Discharge status: Home / Self Care | Payer: 59 | Attending: Emergency Medicine | Admitting: Emergency Medicine

## 2022-03-28 DIAGNOSIS — Y92410 Unspecified street and highway as the place of occurrence of the external cause: Secondary | ICD-10-CM | POA: Insufficient documentation

## 2022-03-28 DIAGNOSIS — I10 Essential (primary) hypertension: Secondary | ICD-10-CM | POA: Diagnosis not present

## 2022-03-28 DIAGNOSIS — S161XXA Strain of muscle, fascia and tendon at neck level, initial encounter: Secondary | ICD-10-CM | POA: Diagnosis not present

## 2022-03-28 DIAGNOSIS — Z79899 Other long term (current) drug therapy: Secondary | ICD-10-CM | POA: Insufficient documentation

## 2022-03-28 DIAGNOSIS — S39012A Strain of muscle, fascia and tendon of lower back, initial encounter: Secondary | ICD-10-CM

## 2022-03-28 DIAGNOSIS — M542 Cervicalgia: Secondary | ICD-10-CM | POA: Diagnosis present

## 2022-03-28 MED ORDER — AMLODIPINE BESYLATE 5 MG PO TABS
5.0000 mg | ORAL_TABLET | Freq: Once | ORAL | Status: AC
  Administered 2022-03-28: 5 mg via ORAL
  Filled 2022-03-28: qty 1
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
## 2022-03-28 NOTE — ED Notes (Signed)
Pt did not want to have her vitals taken again for discharge.
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
## 2022-08-13 ENCOUNTER — Ambulatory Visit: Payer: 59 | Admitting: Cardiology

## 2022-08-13 ENCOUNTER — Encounter: Payer: Self-pay | Admitting: Cardiology

## 2022-08-13 VITALS — BP 180/112 | HR 85 | Resp 16 | Ht 59.0 in | Wt 142.0 lb

## 2022-08-13 DIAGNOSIS — E782 Mixed hyperlipidemia: Secondary | ICD-10-CM

## 2022-08-13 DIAGNOSIS — I493 Ventricular premature depolarization: Secondary | ICD-10-CM

## 2022-08-13 DIAGNOSIS — I10 Essential (primary) hypertension: Secondary | ICD-10-CM

## 2022-08-13 NOTE — Progress Notes (Signed)
ID:  Emily Adkins, DOB 1950/12/15, MRN [id number]  PCP:  Gaspar Garbe, MD  Cardiologist:  Tessa Lerner, DO, [facility] (established care 08/13/22)  REASON FOR CONSULT: Premature ventricular contractions  REQUESTING PHYSICIAN:  Tisovec, Adelfa Koh, MD [address] [place],  [place] [address]  Chief Complaint  Patient presents with   PCV   New Patient (Initial Visit)    Referred by Lydia Guiles, NP    HPI  Emily Adkins is a 72 y.o. African-American female who presents to the clinic for evaluation of PVCs at the request of Tisovec, Adelfa Koh, MD. Her past medical history and cardiovascular risk factors include: Hypertension, hyperlipidemia, osteopenia, anemia, history of endometrial cancer.  Patient states that she recently had a insurance arranged home health care visit during which time she was told to have an irregular heartbeat.  She followed up with her PCP and was noted to have PVCs and ventricular trigeminal pattern based on the documentation provided by her PCP.  Clinically denies palpitations, near-syncope, syncope, shortness of breath or chest pain.  No energy drinks, weight loss supplements, caffeinated beverages, sodas, recreational drugs, stimulants.  No known thyroid disease.  Does have history of anemia.  FUNCTIONAL STATUS: 2 days a week, 45 minutes each, does water aerobics.  CARDIAC DATABASE: EKG: August 13, 2022: Sinus rhythm, 79 bpm, frequent PVCs in ventricular trigeminal pattern, LAE  Echocardiogram: No results found for this or any previous visit from the past 1095 days.   Stress Testing: No results found for this or any previous visit from the past 1095 days.   ALLERGIES: Allergies  Allergen Reactions   Naproxen Nausea Only    MEDICATION LIST PRIOR TO VISIT: Current Meds  Medication Sig   benazepril-hydrochlorthiazide (LOTENSIN HCT) 20-25 MG per tablet Take 1 tablet by mouth daily.   Calcium Carbonate-Vitamin D (CALCIUM + D PO) Take 1 tablet  by mouth 2 (two) times daily.   Multiple Vitamin (MULITIVITAMIN WITH MINERALS) TABS Take 1 tablet by mouth daily.   potassium chloride (K-DUR,KLOR-CON) 10 MEQ tablet Take 10 mEq by mouth 2 (two) times daily.   pravastatin (PRAVACHOL) 40 MG tablet Take 40 mg by mouth daily.   triamcinolone cream (KENALOG) 0.1 % Apply 1 Application topically 2 (two) times daily.     PAST MEDICAL HISTORY: Past Medical History:  Diagnosis Date   Allergy    Anemia    in past   Blood transfusion without reported diagnosis 2011   bleeding related to cervical cancer surgery   Cancer [facility]) 2011   cervical cancer   Hyperlipidemia    Hypertension     PAST SURGICAL HISTORY: Past Surgical History:  Procedure Laterality Date   ABDOMINAL HYSTERECTOMY  2011   COLONOSCOPY     ROTATOR CUFF REPAIR Right     FAMILY HISTORY: The patient family history includes Alcohol abuse in her brother; Drug abuse in her brother and sister; Heart disease in her brother and brother; Hyperlipidemia in her brother, father, mother, sister, and son; Hypertension in her brother, father, mother, sister, and son.  SOCIAL HISTORY:  The patient  reports that she has never smoked. She has never used smokeless tobacco. She reports that she does not drink alcohol and does not use drugs.  REVIEW OF SYSTEMS: Review of Systems  Cardiovascular:  Negative for chest pain, claudication, dyspnea on exertion, irregular heartbeat, leg swelling, near-syncope, orthopnea, palpitations, paroxysmal nocturnal dyspnea and syncope.  Respiratory:  Negative for shortness of breath.   Hematologic/Lymphatic: Negative for bleeding  problem.  Musculoskeletal:  Negative for muscle cramps and myalgias.  Neurological:  Negative for dizziness and light-headedness.    PHYSICAL EXAM:    08/13/2022    1:11 PM 08/13/2022    1:07 PM 03/28/2022   11:36 AM  Vitals with BMI  Height  4\' 11"    Weight  142 lbs   BMI  28.67   Systolic 180 205 409  Diastolic 112 116 96   Pulse 85 96 81    Physical Exam  Constitutional: No distress.  Age appropriate, hemodynamically stable.   Neck: No JVD present.  Cardiovascular: Normal rate, regular rhythm, S1 normal, S2 normal, intact distal pulses and normal pulses. Exam reveals no gallop, no S3 and no S4.  No murmur heard. Pulmonary/Chest: Effort normal and breath sounds normal. No stridor. She has no wheezes. She has no rales.  Abdominal: Soft. Bowel sounds are normal. She exhibits no distension. There is no abdominal tenderness.  Musculoskeletal:        General: No edema.     Cervical back: Neck supple.  Neurological: She is alert and oriented to person, place, and time. She has intact cranial nerves (2-12).  Skin: Skin is warm and moist.   LABORATORY DATA:    Latest Ref Rng & Units 06/08/2009    5:20 AM 06/06/2009    9:10 AM  CBC  WBC 4.0 - 10.5 K/uL 7.7  4.2   Hemoglobin 12.0 - 15.0 g/dL 81.1  91.4   Hematocrit 36.0 - 46.0 % 35.2  39.4   Platelets 150 - 400 K/uL 171  216        Latest Ref Rng & Units 06/08/2009    5:20 AM 06/06/2009    9:10 AM  CMP  Glucose 70 - 99 mg/dL 782  86   BUN 6 - 23 mg/dL 6  13   Creatinine 0.4 - 1.2 mg/dL 9.56  2.13   Sodium 086 - 145 mEq/L 136 DELTA CHECK NOTED  143   Potassium 3.5 - 5.1 mEq/L 4.1 DELTA CHECK NOTED  3.1   Chloride 96 - 112 mEq/L 104  105   CO2 19 - 32 mEq/L 27  31   Calcium 8.4 - 10.5 mg/dL 8.0  9.7   Total Protein 6.0 - 8.3 g/dL  7.7   Total Bilirubin 0.3 - 1.2 mg/dL  0.5   Alkaline Phos 39 - 117 U/L  70   AST 0 - 37 U/L  25   ALT 0 - 35 U/L  21     No results found for: "CHOL", "HDL", "LDLCALC", "LDLDIRECT", "TRIG", "CHOLHDL" No components found for: "NTPROBNP" No results for input(s): "PROBNP" in the last 8760 hours. No results for input(s): "TSH" in the last 8760 hours.  BMP No results for input(s): "NA", "K", "CL", "CO2", "GLUCOSE", "BUN", "CREATININE", "CALCIUM", "GFRNONAA", "GFRAA" in the last 8760 hours.  HEMOGLOBIN A1C No results  found for: "HGBA1C", "MPG"  External Labs: Collected: June 14, 2022 provided by PCP. Hemoglobin 13.2, hematocrit 40.7%. Sodium 141, potassium 3.6, chloride 103, bicarb 27. AST 29, ALT 28, alkaline phosphatase 76. BUN 17, creatinine 0.9. Total cholesterol 192, triglycerides 58, HDL 70, LDL 110, non-HDL 122. A1c 5.6   IMPRESSION:    ICD-10-CM   1. 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 Essential hypertension  I10 PCV ECHOCARDIOGRAM COMPLETE    3. Mixed hyperlipidemia  E78.2  RECOMMENDATIONS: Emily Adkins is a 72 y.o. African-American female whose past medical history and cardiac risk factors include: Hypertension, hyperlipidemia, osteopenia, anemia, history of endometrial cancer.  Frequent PVCs EKG shows sinus rhythm with frequent PVCs in a ventricular trigeminy pattern. Zio patch 3 days to evaluate for PVC burden. Otherwise she remains asymptomatic  Essential hypertension Office blood pressures are not at goal. Patient states that she did take her medications this morning.  However, just received some bad news which may be contributory. I called the patient after the office visit and she checked it while she was at a nursery and her SBP is down to 140 mmHg.  I have asked her to keep a log of her blood pressures to see if further medication titration is warranted. Reemphasized importance of a low-salt diet  Mixed hyperlipidemia Currently on pravastatin.   She denies myalgia or other side effects. Most recent lipids dated April 2024, independently reviewed as noted above. Currently managed by primary care provider.  Data Reviewed: I have independently reviewed external notes provided by the referring provider as part of this office visit.   I have independently reviewed results of EKG, labs as part of medical decision making. I have ordered the following tests:  Orders Placed This Encounter  Procedures   TSH   LONG TERM  MONITOR (3-14 DAYS)    Standing Status:   Future    Number of Occurrences:   1    Order Specific Question:   Where should this test be performed?    Answer:   [facility]    Order Specific Question:   Does the patient have an implanted cardiac device?    Answer:   No    Order Specific Question:   Prescribed days of wear    Answer:   3    Order Specific Question:   Type of enrollment    Answer:   Clinic Enrollment   EKG 12-Lead   PCV ECHOCARDIOGRAM COMPLETE    Standing Status:   Future    Standing Expiration Date:   08/13/2023   I have not made medications changes at today's encounter as noted above.  FINAL MEDICATION LIST END OF ENCOUNTER: No orders of the defined types were placed in this encounter.   There are no discontinued medications.   Current Outpatient Medications:    benazepril-hydrochlorthiazide (LOTENSIN HCT) 20-25 MG per tablet, Take 1 tablet by mouth daily., Disp: , Rfl:    Calcium Carbonate-Vitamin D (CALCIUM + D PO), Take 1 tablet by mouth 2 (two) times daily., Disp: , Rfl:    Multiple Vitamin (MULITIVITAMIN WITH MINERALS) TABS, Take 1 tablet by mouth daily., Disp: , Rfl:    potassium chloride (K-DUR,KLOR-CON) 10 MEQ tablet, Take 10 mEq by mouth 2 (two) times daily., Disp: , Rfl:    pravastatin (PRAVACHOL) 40 MG tablet, Take 40 mg by mouth daily., Disp: , Rfl:    triamcinolone cream (KENALOG) 0.1 %, Apply 1 Application topically 2 (two) times daily., Disp: , Rfl:   Orders Placed This Encounter  Procedures   TSH   LONG TERM MONITOR (3-14 DAYS)   EKG 12-Lead   PCV ECHOCARDIOGRAM COMPLETE    There are no Patient Instructions on file for this visit.   --Continue cardiac medications as reconciled in final medication list. --Return in about 6 weeks (around 09/24/2022) for Follow up PVC. or sooner if needed. --Continue follow-up with your primary care physician regarding the management of your other chronic comorbid conditions.  Patient's questions  and  concerns were addressed to her satisfaction. She voices understanding of the instructions provided during this encounter.   This note was created using a voice recognition software as a result there may be grammatical errors inadvertently enclosed that do not reflect the nature of this encounter. Every attempt is made to correct such errors.  Tessa Lerner, [place], [facility]  Pager:  [phone] Office: [phone]

## 2022-08-20 ENCOUNTER — Other Ambulatory Visit: Payer: 59

## 2022-08-20 DIAGNOSIS — I493 Ventricular premature depolarization: Secondary | ICD-10-CM

## 2022-09-04 ENCOUNTER — Ambulatory Visit: Payer: 59

## 2022-09-04 DIAGNOSIS — I10 Essential (primary) hypertension: Secondary | ICD-10-CM

## 2022-09-25 ENCOUNTER — Other Ambulatory Visit: Payer: 59

## 2022-09-25 ENCOUNTER — Encounter: Payer: Self-pay | Admitting: Cardiology

## 2022-09-25 ENCOUNTER — Ambulatory Visit: Payer: 59 | Admitting: Cardiology

## 2022-09-25 VITALS — BP 187/94 | HR 80 | Resp 16 | Ht 59.0 in | Wt 143.0 lb

## 2022-09-25 DIAGNOSIS — E782 Mixed hyperlipidemia: Secondary | ICD-10-CM

## 2022-09-25 DIAGNOSIS — I10 Essential (primary) hypertension: Secondary | ICD-10-CM

## 2022-09-25 DIAGNOSIS — I493 Ventricular premature depolarization: Secondary | ICD-10-CM

## 2022-09-25 NOTE — Progress Notes (Signed)
ID:  Emily Adkins, DOB 1951/02/22, MRN [id number]  PCP:  Gaspar Garbe, MD  Cardiologist:  Tessa Lerner, DO, [facility] (established care 08/13/22)  Date: 09/25/22 Last Office Visit: 08/13/2022  Chief Complaint  Patient presents with   PVC   Follow-up    6 weeks    HPI  Emily Adkins is a 72 y.o. African-American female whose  past medical history and cardiovascular risk factors include: Hypertension, hyperlipidemia, osteopenia, anemia, history of endometrial cancer.  She recently had a home health care visit arranged by her insurance company and was noted to have an irregular heartbeat.  She followed up with PCP and EKG noted frequent PVCs and ventricular trigeminal pattern.  She was referred to cardiology for further evaluation.  No identifiable reversible cause.  She is supposed to have a 3-day Zio patch to evaluate for PVC burden but this is pending.  She had an echocardiogram which notes preserved LVEF, indeterminant diastolic filling pattern, elevated left atrial pressure, see report for additional details.  Office blood pressures have been elevated on multiple visits.  However, patient states that she checks her blood pressures at home daily and SBP usually ranges between 120-130 mmHg.  Her blood pressure currently being managed by PCP.  In the interim she denies anginal chest pain, heart failure symptoms, near-syncope or syncopal event.  FUNCTIONAL STATUS: 2 days a week, 45 minutes each, does water aerobics.  CARDIAC DATABASE: EKG: August 13, 2022: Sinus rhythm, 79 bpm, frequent PVCs in ventricular trigeminal pattern, LAE  Echocardiogram: 09/04/2022:  ECG: Sinus w/ rare PVCs.  Normal LV systolic function with visual EF 50-55%. Left ventricle cavity  is normal in size. Normal left ventricular wall thickness.  Normal global wall motion. Indeterminate diastolic filling pattern, elevated LAP.  Trace tricuspid regurgitation. No evidence of tricuspid stenosis. No evidence of  pulmonary hypertension.  No prior study for comparison.    Stress Testing: No results found for this or any previous visit from the past 1095 days.   ALLERGIES: Allergies  Allergen Reactions   Naproxen Nausea Only    MEDICATION LIST PRIOR TO VISIT: Current Meds  Medication Sig   benazepril-hydrochlorthiazide (LOTENSIN HCT) 20-25 MG per tablet Take 1 tablet by mouth daily.   Calcium Carbonate-Vitamin D (CALCIUM + D PO) Take 1 tablet by mouth 2 (two) times daily.   Multiple Vitamin (MULITIVITAMIN WITH MINERALS) TABS Take 1 tablet by mouth daily.   potassium chloride (K-DUR,KLOR-CON) 10 MEQ tablet Take 10 mEq by mouth 2 (two) times daily.   pravastatin (PRAVACHOL) 40 MG tablet Take 40 mg by mouth daily.   triamcinolone cream (KENALOG) 0.1 % Apply 1 Application topically 2 (two) times daily.     PAST MEDICAL HISTORY: Past Medical History:  Diagnosis Date   Allergy    Anemia    in past   Blood transfusion without reported diagnosis 2011   bleeding related to cervical cancer surgery   Cancer [facility]) 2011   cervical cancer   Hyperlipidemia    Hypertension     PAST SURGICAL HISTORY: Past Surgical History:  Procedure Laterality Date   ABDOMINAL HYSTERECTOMY  2011   COLONOSCOPY     ROTATOR CUFF REPAIR Right     FAMILY HISTORY: The patient family history includes Alcohol abuse in her brother; Drug abuse in her brother and sister; Heart disease in her brother and brother; Hyperlipidemia in her brother, father, mother, sister, and son; Hypertension in her brother, father, mother, sister, and son.  SOCIAL HISTORY:  The  patient  reports that she has never smoked. She has never used smokeless tobacco. She reports that she does not drink alcohol and does not use drugs.  REVIEW OF SYSTEMS: Review of Systems  Cardiovascular:  Negative for chest pain, claudication, dyspnea on exertion, irregular heartbeat, leg swelling, near-syncope, orthopnea, palpitations, paroxysmal nocturnal  dyspnea and syncope.  Respiratory:  Negative for shortness of breath.   Hematologic/Lymphatic: Negative for bleeding problem.  Musculoskeletal:  Negative for muscle cramps and myalgias.  Neurological:  Negative for dizziness and light-headedness.    PHYSICAL EXAM:    09/25/2022    1:39 PM 09/25/2022    1:37 PM 08/13/2022    1:11 PM  Vitals with BMI  Height  4\' 11"    Weight  143 lbs   BMI  28.87   Systolic 187 190 161  Diastolic 94 100 112  Pulse 80 80 85    Physical Exam  Constitutional: No distress.  Age appropriate, hemodynamically stable.   Neck: No JVD present.  Cardiovascular: Normal rate, regular rhythm, S1 normal, S2 normal, intact distal pulses and normal pulses. Exam reveals no gallop, no S3 and no S4.  No murmur heard. Pulmonary/Chest: Effort normal and breath sounds normal. No stridor. She has no wheezes. She has no rales.  Abdominal: Soft. Bowel sounds are normal. She exhibits no distension. There is no abdominal tenderness.  Musculoskeletal:        General: No edema.     Cervical back: Neck supple.  Neurological: She is alert and oriented to person, place, and time. She has intact cranial nerves (2-12).  Skin: Skin is warm and moist.   LABORATORY DATA:    Latest Ref Rng & Units 06/08/2009    5:20 AM 06/06/2009    9:10 AM  CBC  WBC 4.0 - 10.5 K/uL 7.7  4.2   Hemoglobin 12.0 - 15.0 g/dL 09.6  04.5   Hematocrit 36.0 - 46.0 % 35.2  39.4   Platelets 150 - 400 K/uL 171  216        Latest Ref Rng & Units 06/08/2009    5:20 AM 06/06/2009    9:10 AM  CMP  Glucose 70 - 99 mg/dL 409  86   BUN 6 - 23 mg/dL 6  13   Creatinine 0.4 - 1.2 mg/dL 8.11  9.14   Sodium 782 - 145 mEq/L 136 DELTA CHECK NOTED  143   Potassium 3.5 - 5.1 mEq/L 4.1 DELTA CHECK NOTED  3.1   Chloride 96 - 112 mEq/L 104  105   CO2 19 - 32 mEq/L 27  31   Calcium 8.4 - 10.5 mg/dL 8.0  9.7   Total Protein 6.0 - 8.3 g/dL  7.7   Total Bilirubin 0.3 - 1.2 mg/dL  0.5   Alkaline Phos 39 - 117 U/L  70    AST 0 - 37 U/L  25   ALT 0 - 35 U/L  21     No results found for: "CHOL", "HDL", "LDLCALC", "LDLDIRECT", "TRIG", "CHOLHDL" No components found for: "NTPROBNP" No results for input(s): "PROBNP" in the last 8760 hours. No results for input(s): "TSH" in the last 8760 hours.  BMP No results for input(s): "NA", "K", "CL", "CO2", "GLUCOSE", "BUN", "CREATININE", "CALCIUM", "GFRNONAA", "GFRAA" in the last 8760 hours.  HEMOGLOBIN A1C No results found for: "HGBA1C", "MPG"  External Labs: Collected: June 14, 2022 provided by PCP. Hemoglobin 13.2, hematocrit 40.7%. Sodium 141, potassium 3.6, chloride 103, bicarb 27. AST 29, ALT 28, alkaline  phosphatase 76. BUN 17, creatinine 0.9. Total cholesterol 192, triglycerides 58, HDL 70, LDL 110, non-HDL 122. 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 Essential hypertension  I10     3. Mixed hyperlipidemia  E78.2         RECOMMENDATIONS: Emily Adkins is a 71 y.o. African-American female whose past medical history and cardiac risk factors include: Hypertension, hyperlipidemia, osteopenia, anemia, history of endometrial cancer.  Frequent PVCs No identifiable reversible cause. Zio patch is still pending. Echocardiogram results reviewed and noted above.  Essential hypertension Office blood pressures have been elevated in the past. She checks her blood pressures regularly and SBP ranges between 120-130 mmHg per patient. I suspect a degree of whitecoat hypertension. Currently managed by primary care provider.  Mixed hyperlipidemia Currently on pravastatin. Does not endorse myalgias. Most recent lipids from April 2024 independently reviewed. Currently managed by primary care provider.  FINAL MEDICATION LIST END OF ENCOUNTER: No orders of the defined types were placed in this encounter.   There are no discontinued medications.   Current Outpatient Medications:    benazepril-hydrochlorthiazide (LOTENSIN HCT)  20-25 MG per tablet, Take 1 tablet by mouth daily., Disp: , Rfl:    Calcium Carbonate-Vitamin D (CALCIUM + D PO), Take 1 tablet by mouth 2 (two) times daily., Disp: , Rfl:    Multiple Vitamin (MULITIVITAMIN WITH MINERALS) TABS, Take 1 tablet by mouth daily., Disp: , Rfl:    potassium chloride (K-DUR,KLOR-CON) 10 MEQ tablet, Take 10 mEq by mouth 2 (two) times daily., Disp: , Rfl:    pravastatin (PRAVACHOL) 40 MG tablet, Take 40 mg by mouth daily., Disp: , Rfl:    triamcinolone cream (KENALOG) 0.1 %, Apply 1 Application topically 2 (two) times daily., Disp: , Rfl:   No orders of the defined types were placed in this encounter.   There are no Patient Instructions on file for this visit.   --Continue cardiac medications as reconciled in final medication list. --Return in about 4 weeks (around 10/23/2022) for Follow up PVCs. or sooner if needed. --Continue follow-up with your primary care physician regarding the management of your other chronic comorbid conditions.  Patient's questions and concerns were addressed to her satisfaction. She voices understanding of the instructions provided during this encounter.   This note was created using a voice recognition software as a result there may be grammatical errors inadvertently enclosed that do not reflect the nature of this encounter. Every attempt is made to correct such errors.  Tessa Lerner, [place], [facility]  Pager:  [phone] Office: [phone]

## 2022-10-17 NOTE — Progress Notes (Signed)
Patient is aware and confirmed appointment on 10/26/22.

## 2022-10-26 ENCOUNTER — Ambulatory Visit: Payer: 59 | Admitting: Cardiology

## 2022-10-26 ENCOUNTER — Encounter: Payer: Self-pay | Admitting: Cardiology

## 2022-10-26 VITALS — BP 145/98 | HR 62 | Resp 16 | Ht 59.0 in | Wt 142.0 lb

## 2022-10-26 DIAGNOSIS — I10 Essential (primary) hypertension: Secondary | ICD-10-CM

## 2022-10-26 DIAGNOSIS — I493 Ventricular premature depolarization: Secondary | ICD-10-CM

## 2022-10-26 DIAGNOSIS — E782 Mixed hyperlipidemia: Secondary | ICD-10-CM

## 2022-10-26 MED ORDER — DILTIAZEM HCL ER BEADS 180 MG PO CP24: 180.0000 mg | ORAL_CAPSULE | Freq: Every day | ORAL | 0 refills | Status: DC

## 2022-10-26 NOTE — Progress Notes (Signed)
ID:  Emily Adkins, DOB August 19, 1950, MRN [id number]  PCP:  Gaspar Garbe, MD  Cardiologist:  Tessa Lerner, DO, [facility] (established care 08/13/22)  Date: 10/26/22 Last Office Visit: 09/25/2022  Chief Complaint  Patient presents with   Follow-up    PVCs    HPI  Emily Adkins is a 72 y.o. African-American female whose  past medical history and cardiovascular risk factors include: Hypertension, hyperlipidemia, osteopenia, anemia, history of endometrial cancer.  Patient was referred to the practice for evaluation of premature ventricular contractions.  She had a home health care visit in the past and the nursing staff noted an irregular heart rate.  She followed up with PCP and was noted to have frequent PVCs and a ventricular trigeminy pattern and was referred to cardiology for further evaluation and management.  She underwent a Zio patch which notes a PVC burden of approximately 9.6%.  LVEF is 50-55% with indeterminate diastolic dysfunction.    She presents today for follow-up.  He denies anginal chest pain or heart failure symptoms.  Home blood pressures range between 120-130 mmHg.  Still continues to have irregularity at times but when she starts exercising patient feels much better.  FUNCTIONAL STATUS: 2 days a week, 45 minutes each, does water aerobics.  CARDIAC DATABASE: EKG: August 13, 2022: Sinus rhythm, 79 bpm, frequent PVCs in ventricular trigeminal pattern, LAE  Echocardiogram: 09/04/2022:  ECG: Sinus w/ rare PVCs.  Normal LV systolic function with visual EF 50-55%. Left ventricle cavity  is normal in size. Normal left ventricular wall thickness.  Normal global wall motion. Indeterminate diastolic filling pattern, elevated LAP.  Trace tricuspid regurgitation. No evidence of tricuspid stenosis. No evidence of pulmonary hypertension.  No prior study for comparison.   Stress Testing: No results found for this or any previous visit from the past 1095 days.  Cardiac monitor  [facility] Patch): September 25, 2022 - September 27, 2022 Dominant rhythm sinus. Heart rate 45-121 bpm. Avg HR 67 bpm. No atrial fibrillation, supraventricular tachycardia, ventricular tachycardia, high grade AV block, pauses (3 seconds or longer). Total ventricular ectopic burden 9.6% (predominantly of isolated beats). Total supraventricular ectopic burden <1%. Patient triggered events: 5. Underlying rhythm sinus with occasional PVCs.     ALLERGIES: Allergies  Allergen Reactions   Naproxen Nausea Only    MEDICATION LIST PRIOR TO VISIT: Current Meds  Medication Sig   benazepril-hydrochlorthiazide (LOTENSIN HCT) 20-25 MG per tablet Take 1 tablet by mouth daily.   Calcium Carbonate-Vitamin D (CALCIUM + D PO) Take 1 tablet by mouth 2 (two) times daily.   diltiazem (TIAZAC) 180 MG 24 hr capsule Take 1 capsule (180 mg total) by mouth daily at 10 pm.   Multiple Vitamin (MULITIVITAMIN WITH MINERALS) TABS Take 1 tablet by mouth daily.   potassium chloride (K-DUR,KLOR-CON) 10 MEQ tablet Take 10 mEq by mouth 2 (two) times daily.   pravastatin (PRAVACHOL) 40 MG tablet Take 40 mg by mouth daily.   triamcinolone cream (KENALOG) 0.1 % Apply 1 Application topically 2 (two) times daily.     PAST MEDICAL HISTORY: Past Medical History:  Diagnosis Date   Allergy    Anemia    in past   Blood transfusion without reported diagnosis 2011   bleeding related to cervical cancer surgery   Cancer [facility]) 2011   cervical cancer   Hyperlipidemia    Hypertension     PAST SURGICAL HISTORY: Past Surgical History:  Procedure Laterality Date   ABDOMINAL HYSTERECTOMY  2011   COLONOSCOPY  ROTATOR CUFF REPAIR Right     FAMILY HISTORY: The patient family history includes Alcohol abuse in her brother; Drug abuse in her brother and sister; Heart disease in her brother and brother; Hyperlipidemia in her brother, father, mother, sister, and son; Hypertension in her brother, father, mother, sister, and son.  SOCIAL  HISTORY:  The patient  reports that she has never smoked. She has never used smokeless tobacco. She reports that she does not drink alcohol and does not use drugs.  REVIEW OF SYSTEMS: Review of Systems  Cardiovascular:  Negative for chest pain, claudication, dyspnea on exertion, irregular heartbeat, leg swelling, near-syncope, orthopnea, palpitations, paroxysmal nocturnal dyspnea and syncope.  Respiratory:  Negative for shortness of breath.   Hematologic/Lymphatic: Negative for bleeding problem.  Musculoskeletal:  Negative for muscle cramps and myalgias.  Neurological:  Negative for dizziness and light-headedness.    PHYSICAL EXAM:    10/26/2022    1:48 PM 09/25/2022    1:39 PM 09/25/2022    1:37 PM  Vitals with BMI  Height 4\' 11"   4\' 11"   Weight 142 lbs  143 lbs  BMI 28.67  28.87  Systolic 145 187 956  Diastolic 98 94 100  Pulse 62 80 80    Physical Exam  Constitutional: No distress.  Age appropriate, hemodynamically stable.   Neck: No JVD present.  Cardiovascular: Normal rate, regular rhythm, S1 normal, S2 normal, intact distal pulses and normal pulses. Exam reveals no gallop, no S3 and no S4.  No murmur heard. Pulmonary/Chest: Effort normal and breath sounds normal. No stridor. She has no wheezes. She has no rales.  Abdominal: Soft. Bowel sounds are normal. She exhibits no distension. There is no abdominal tenderness.  Musculoskeletal:        General: No edema.     Cervical back: Neck supple.  Neurological: She is alert and oriented to person, place, and time. She has intact cranial nerves (2-12).  Skin: Skin is warm and moist.   LABORATORY DATA:    Latest Ref Rng & Units 06/08/2009    5:20 AM 06/06/2009    9:10 AM  CBC  WBC 4.0 - 10.5 K/uL 7.7  4.2   Hemoglobin 12.0 - 15.0 g/dL 21.3  08.6   Hematocrit 36.0 - 46.0 % 35.2  39.4   Platelets 150 - 400 K/uL 171  216        Latest Ref Rng & Units 06/08/2009    5:20 AM 06/06/2009    9:10 AM  CMP  Glucose 70 - 99 mg/dL  578  86   BUN 6 - 23 mg/dL 6  13   Creatinine 0.4 - 1.2 mg/dL 4.69  6.29   Sodium 528 - 145 mEq/L 136 DELTA CHECK NOTED  143   Potassium 3.5 - 5.1 mEq/L 4.1 DELTA CHECK NOTED  3.1   Chloride 96 - 112 mEq/L 104  105   CO2 19 - 32 mEq/L 27  31   Calcium 8.4 - 10.5 mg/dL 8.0  9.7   Total Protein 6.0 - 8.3 g/dL  7.7   Total Bilirubin 0.3 - 1.2 mg/dL  0.5   Alkaline Phos 39 - 117 U/L  70   AST 0 - 37 U/L  25   ALT 0 - 35 U/L  21     No results found for: "CHOL", "HDL", "LDLCALC", "LDLDIRECT", "TRIG", "CHOLHDL" No components found for: "NTPROBNP" No results for input(s): "PROBNP" in the last 8760 hours. No results for input(s): "TSH" in the last  8760 hours.  BMP No results for input(s): "NA", "K", "CL", "CO2", "GLUCOSE", "BUN", "CREATININE", "CALCIUM", "GFRNONAA", "GFRAA" in the last 8760 hours.  HEMOGLOBIN A1C No results found for: "HGBA1C", "MPG"  External Labs: Collected: June 14, 2022 provided by PCP. Hemoglobin 13.2, hematocrit 40.7%. Sodium 141, potassium 3.6, chloride 103, bicarb 27. AST 29, ALT 28, alkaline phosphatase 76. BUN 17, creatinine 0.9. Total cholesterol 192, triglycerides 58, HDL 70, LDL 110, non-HDL 122. 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 Essential hypertension  I10     3. Mixed hyperlipidemia  E78.2          RECOMMENDATIONS: Serenidy Amabile is a 72 y.o. African-American female whose past medical history and cardiac risk factors include: Hypertension, hyperlipidemia, osteopenia, anemia, history of endometrial cancer.  Frequent PVCs PVC burden approximately 9.6%. Given the fact that she has low normal LVEF, elevated PVC burden, shared decision was to start medical therapy to hopefully prevent PVC induced cardiomyopathy. Will start at a low dose at 180 mg of diltiazem p.o. every afternoon and uptitrate based on how she feels.   Will likely proceed forward with a Zio patch at the  next visit   Essential hypertension Office blood pressures are currently not at goal. Home blood pressures range between 120-130 mmHg. She currently takes her antihypertensive medications in the morning and therefore recommended to take diltiazem at night.   Currently managed by primary care provider.  Mixed hyperlipidemia Currently on pravastatin. Does not endorse myalgias. Most recent lipids from April 2024 independently reviewed. Currently managed by primary care provider.  FINAL MEDICATION LIST END OF ENCOUNTER: Meds ordered this encounter  Medications   diltiazem (TIAZAC) 180 MG 24 hr capsule    Sig: Take 1 capsule (180 mg total) by mouth daily at 10 pm.    Dispense:  90 capsule    Refill:  0    There are no discontinued medications.   Current Outpatient Medications:    benazepril-hydrochlorthiazide (LOTENSIN HCT) 20-25 MG per tablet, Take 1 tablet by mouth daily., Disp: , Rfl:    Calcium Carbonate-Vitamin D (CALCIUM + D PO), Take 1 tablet by mouth 2 (two) times daily., Disp: , Rfl:    diltiazem (TIAZAC) 180 MG 24 hr capsule, Take 1 capsule (180 mg total) by mouth daily at 10 pm., Disp: 90 capsule, Rfl: 0   Multiple Vitamin (MULITIVITAMIN WITH MINERALS) TABS, Take 1 tablet by mouth daily., Disp: , Rfl:    potassium chloride (K-DUR,KLOR-CON) 10 MEQ tablet, Take 10 mEq by mouth 2 (two) times daily., Disp: , Rfl:    pravastatin (PRAVACHOL) 40 MG tablet, Take 40 mg by mouth daily., Disp: , Rfl:    triamcinolone cream (KENALOG) 0.1 %, Apply 1 Application topically 2 (two) times daily., Disp: , Rfl:   No orders of the defined types were placed in this encounter.   There are no Patient Instructions on file for this visit.   --Continue cardiac medications as reconciled in final medication list. --Return in about 6 months (around 04/28/2023) for Follow up PVC. or sooner if needed. --Continue follow-up with your primary care physician regarding the management of your other chronic  comorbid conditions.  Patient's questions and concerns were addressed to her satisfaction. She voices understanding of the instructions provided during this encounter.   This note was created using a voice recognition software as a result there may be grammatical errors  inadvertently enclosed that do not reflect the nature of this encounter. Every attempt is made to correct such errors.  Tessa Lerner, [place], [facility]  Pager:  [phone] Office: [phone]

## 2022-12-27 ENCOUNTER — Other Ambulatory Visit: Payer: Self-pay | Admitting: Cardiology

## 2022-12-27 DIAGNOSIS — I493 Ventricular premature depolarization: Secondary | ICD-10-CM

## 2023-01-29 ENCOUNTER — Telehealth: Payer: Self-pay | Admitting: Cardiology

## 2023-01-29 NOTE — Telephone Encounter (Signed)
Left message to call office

## 2023-01-29 NOTE — Telephone Encounter (Signed)
LVM for pt explaining to continue the Diltiazem unless she is not tolerating the medication d/t it being prescribed for her frequent PVCs. Pt told to call our office back to let us know if she is still tolerating the medication and also to let us know she got our message.

## 2023-01-29 NOTE — Telephone Encounter (Signed)
Pt c/o medication issue:  1. Name of Medication:   diltiazem (TIAZAC) 180 MG 24 hr capsule    2. How are you currently taking this medication (dosage and times per day)? As written  3. Are you having a reaction (difficulty breathing--STAT)? No   4. What is your medication issue?  Pt wants to know if she has to keep taking this medication. She would like a call back.

## 2023-02-04 ENCOUNTER — Telehealth: Payer: Self-pay

## 2023-02-04 NOTE — Telephone Encounter (Signed)
Spoke with pt over the phone regarding her questions with Diltiazem. Pt verbalized understanding and had no further questions.

## 2023-04-29 ENCOUNTER — Ambulatory Visit: Payer: Self-pay | Admitting: Cardiology

## 2023-05-20 ENCOUNTER — Ambulatory Visit: Payer: 59 | Admitting: Cardiology

## 2023-06-06 ENCOUNTER — Ambulatory Visit: Payer: 59 | Attending: Cardiology | Admitting: Cardiology

## 2023-06-06 ENCOUNTER — Encounter: Payer: Self-pay | Admitting: Cardiology

## 2023-06-06 VITALS — BP 142/74 | HR 76 | Resp 16 | Ht 59.0 in | Wt 148.4 lb

## 2023-06-06 DIAGNOSIS — I10 Essential (primary) hypertension: Secondary | ICD-10-CM

## 2023-06-06 DIAGNOSIS — I493 Ventricular premature depolarization: Secondary | ICD-10-CM

## 2023-06-06 DIAGNOSIS — E782 Mixed hyperlipidemia: Secondary | ICD-10-CM | POA: Diagnosis not present

## 2023-06-06 MED ORDER — DILTIAZEM HCL ER BEADS 180 MG PO CP24: 180.0000 mg | ORAL_CAPSULE | Freq: Every day | ORAL | 3 refills | Status: AC

## 2023-06-06 NOTE — Progress Notes (Signed)
 Cardiology Office Note:  .   ID:  Emily Adkins, DOB 12/22/1950, MRN [id number] PCP:  Gaspar Garbe, MD  Former Cardiology Providers: None  HeartCare Providers Cardiologist:  Tessa Lerner, DO , [facility] (established care 08/13/2022) Electrophysiologist:  None  Click to update primary MD,subspecialty MD or APP then REFRESH:1}    Chief Complaint  Patient presents with   Frequent PVCs   Follow-up    6 months    History of Present Illness: .   Emily Adkins is a 73 y.o. African-American female whose past medical history and cardiovascular risk factors includes: Premature ventricular contractions, Hypertension, hyperlipidemia, osteopenia, anemia, history of endometrial cancer.   Patient being followed by the practice given her premature ventricular contractions.  In the past she did undergo cardiac monitor which noted a PVC burden of approximately 9.6%.  Echocardiogram noted low normal LVEF and indeterminate diastolic function.  She is currently on diltiazem 180 mg p.o. daily and doing well.  She presents today for follow-up.  Since last office visit patient denies any anginal chest pain or heart failure symptoms.  No hospitalizations or urgent care visits for cardiovascular reasons.  She has been compliant with her medical therapy. Physical endurance remains stable, participates in water aerobics 3 times per week at least 45-60 minutes each time. Home SBP are better controlled.  Review of Systems: .   Review of Systems  Cardiovascular:  Negative for chest pain, claudication, irregular heartbeat, leg swelling, near-syncope, orthopnea, palpitations, paroxysmal nocturnal dyspnea and syncope.  Respiratory:  Negative for shortness of breath.   Hematologic/Lymphatic: Negative for bleeding problem.    Studies Reviewed:   EKG: EKG Interpretation Date/Time:  Thursday June 06 2023 13:36:42 EDT Ventricular Rate:  67 PR Interval:  134 QRS Duration:  82 QT Interval:  444 QTC  Calculation: 469 R Axis:   -28  Text Interpretation: Normal sinus rhythm Possible Left atrial enlargement When compared with ECG of 28-Mar-2022 12:02, Premature ventricular complexes NO LONGER PRESENT Confirmed by Tessa Lerner [phone]) on 06/06/2023 1:52:56 PM  Echocardiogram: 09/04/2022:  ECG: Sinus w/ rare PVCs.  Normal LV systolic function with visual EF 50-55%. Left ventricle cavity  is normal in size. Normal left ventricular wall thickness.  Normal global wall motion. Indeterminate diastolic filling pattern, elevated LAP.  Trace tricuspid regurgitation. No evidence of tricuspid stenosis. No evidence of pulmonary hypertension.  No prior study for comparison.   Cardiac monitor [facility] Patch): September 25, 2022 - September 27, 2022 Dominant rhythm sinus. Heart rate 45-121 bpm. Avg HR 67 bpm. No atrial fibrillation, supraventricular tachycardia, ventricular tachycardia, high grade AV block, pauses (3 seconds or longer). Total ventricular ectopic burden 9.6% (predominantly of isolated beats). Total supraventricular ectopic burden <1%. Patient triggered events: 5. Underlying rhythm sinus with occasional PVCs.   RADIOLOGY: N/A  Risk Assessment/Calculations:   NA   Labs:       Latest Ref Rng & Units 06/08/2009    5:20 AM 06/06/2009    9:10 AM  CBC  WBC 4.0 - 10.5 K/uL 7.7  4.2   Hemoglobin 12.0 - 15.0 g/dL 10.2  72.5   Hematocrit 36.0 - 46.0 % 35.2  39.4   Platelets 150 - 400 K/uL 171  216        Latest Ref Rng & Units 06/08/2009    5:20 AM 06/06/2009    9:10 AM  BMP  Glucose 70 - 99 mg/dL 366  86   BUN 6 - 23 mg/dL 6  13   Creatinine  0.4 - 1.2 mg/dL 1.61  0.96   Sodium 045 - 145 mEq/L 136 DELTA CHECK NOTED  143   Potassium 3.5 - 5.1 mEq/L 4.1 DELTA CHECK NOTED  3.1   Chloride 96 - 112 mEq/L 104  105   CO2 19 - 32 mEq/L 27  31   Calcium 8.4 - 10.5 mg/dL 8.0  9.7       Latest Ref Rng & Units 06/08/2009    5:20 AM 06/06/2009    9:10 AM  CMP  Glucose 70 - 99 mg/dL 409  86   BUN 6  - 23 mg/dL 6  13   Creatinine 0.4 - 1.2 mg/dL 8.11  9.14   Sodium 782 - 145 mEq/L 136 DELTA CHECK NOTED  143   Potassium 3.5 - 5.1 mEq/L 4.1 DELTA CHECK NOTED  3.1   Chloride 96 - 112 mEq/L 104  105   CO2 19 - 32 mEq/L 27  31   Calcium 8.4 - 10.5 mg/dL 8.0  9.7   Total Protein 6.0 - 8.3 g/dL  7.7   Total Bilirubin 0.3 - 1.2 mg/dL  0.5   Alkaline Phos 39 - 117 U/L  70   AST 0 - 37 U/L  25   ALT 0 - 35 U/L  21     No results found for: "CHOL", "HDL", "LDLCALC", "LDLDIRECT", "TRIG", "CHOLHDL" No results for input(s): "LIPOA" in the last 8760 hours. No components found for: "NTPROBNP" No results for input(s): "PROBNP" in the last 8760 hours. No results for input(s): "TSH" in the last 8760 hours.  Physical Exam:    Today's Vitals   06/06/23 1334  BP: (!) 142/74  Pulse: 76  Resp: 16  SpO2: 96%  Weight: 148 lb 6.4 oz (67.3 kg)  Height: 4\' 11"  (1.499 m)   Body mass index is 29.97 kg/m. Wt Readings from Last 3 Encounters:  06/06/23 148 lb 6.4 oz (67.3 kg)  10/26/22 142 lb (64.4 kg)  09/25/22 143 lb (64.9 kg)    Physical Exam  Constitutional: No distress.  Age appropriate, hemodynamically stable.   Neck: No JVD present.  Cardiovascular: Normal rate, regular rhythm, S1 normal, S2 normal, intact distal pulses and normal pulses. Exam reveals no gallop, no S3 and no S4.  No murmur heard. Pulmonary/Chest: Effort normal and breath sounds normal. No stridor. She has no wheezes. She has no rales.  Abdominal: Soft. Bowel sounds are normal. She exhibits no distension. There is no abdominal tenderness.  Musculoskeletal:        General: No edema.     Cervical back: Neck supple.  Neurological: She is alert and oriented to person, place, and time. She has intact cranial nerves (2-12).  Skin: Skin is warm and moist.     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 Essential  hypertension  I10     3. Mixed hyperlipidemia  E78.2        Recommendation(s):  Frequent PVCs Chronic and well-controlled. Continue diltiazem 180 mg p.o. daily. Prior cardiac monitor noted a PVC burden of approximately 9.6%. EKG today illustrates sinus mechanism without ectopy.  Essential hypertension Office blood pressures are not at goal. Home blood pressures are better controlled. Continue benazepril/hydrochlorothiazide 20/25 mg p.o. daily Continue diltiazem 180 mg p.o. daily Cardiology following peripherally, managed by primary care provider.  Mixed hyperlipidemia Continue pravastatin 40 mg  p.o. daily   Orders Placed:  Orders Placed This Encounter  Procedures   EKG 12-Lead     Final Medication List:    Meds ordered this encounter  Medications   diltiazem (TIAZAC) 180 MG 24 hr capsule    Sig: Take 1 capsule (180 mg total) by mouth daily.    Dispense:  90 capsule    Refill:  3    Please send a replace/new response with 100-Day Supply if appropriate to maximize member benefit. Requesting 1 year supply.    Medications Discontinued During This Encounter  Medication Reason   diltiazem (TIAZAC) 180 MG 24 hr capsule Reorder     Current Outpatient Medications:    benazepril-hydrochlorthiazide (LOTENSIN HCT) 20-25 MG per tablet, Take 1 tablet by mouth daily., Disp: , Rfl:    Calcium Carbonate-Vitamin D (CALCIUM + D PO), Take 1 tablet by mouth 2 (two) times daily., Disp: , Rfl:    Multiple Vitamin (MULITIVITAMIN WITH MINERALS) TABS, Take 1 tablet by mouth daily., Disp: , Rfl:    potassium chloride (K-DUR,KLOR-CON) 10 MEQ tablet, Take 10 mEq by mouth 2 (two) times daily., Disp: , Rfl:    pravastatin (PRAVACHOL) 40 MG tablet, Take 40 mg by mouth daily., Disp: , Rfl:    triamcinolone cream (KENALOG) 0.1 %, Apply 1 Application topically 2 (two) times daily., Disp: , Rfl:    diltiazem (TIAZAC) 180 MG 24 hr capsule, Take 1 capsule (180 mg total) by mouth daily., Disp: 90  capsule, Rfl: 3  Consent:   NA  Disposition:   1 year follow-up sooner if needed  Her questions and concerns were addressed to her satisfaction. She voices understanding of the recommendations provided during this encounter.    Signed, Tessa Lerner, DO, [facility]  [address] [place], [place] [address] 06/12/2023 8:44 PM

## 2023-06-06 NOTE — Patient Instructions (Signed)
 Medication Instructions:  Your physician recommends that you continue on your current medications as directed. Please refer to the Current Medication list given to you today.  Refill for Diltiazem (Cardizem) has been sent in to your pharmacy.  *If you need a refill on your cardiac medications before your next appointment, please call your pharmacy*  Lab Work: None ordered today. If you have labs (blood work) drawn today and your tests are completely normal, you will receive your results only by: MyChart Message (if you have MyChart) OR A paper copy in the mail If you have any lab test that is abnormal or we need to change your treatment, we will call you to review the results.  Testing/Procedures: None ordered today.  Follow-Up: At [facility], you and your health needs are our priority.  As part of our continuing mission to provide you with exceptional heart care, we have created designated Provider Care Teams.  These Care Teams include your primary Cardiologist (physician) and Advanced Practice Providers (APPs -  Physician Assistants and Nurse Practitioners) who all work together to provide you with the care you need, when you need it.  We recommend signing up for the patient portal called "MyChart".  Sign up information is provided on this After Visit Summary.  MyChart is used to connect with patients for Virtual Visits (Telemedicine).  Patients are able to view lab/test results, encounter notes, upcoming appointments, etc.  Non-urgent messages can be sent to your provider as well.   To learn more about what you can do with MyChart, go to [email or web address].    Your next appointment:   1 year(s)  The format for your next appointment:   In Person  Provider:   Tessa Lerner, DO {  Other Instructions   1st Floor: - Lobby - Registration  - Pharmacy  - Lab - Cafe  2nd Floor: - PV Lab - Diagnostic Testing (echo, CT, nuclear med)  3rd Floor: - Vacant  4th Floor: -  TCTS (cardiothoracic surgery) - AFib Clinic - Structural Heart Clinic - Vascular Surgery  - Vascular Ultrasound  5th Floor: - HeartCare Cardiology (general and EP) - Clinical Pharmacy for coumadin, hypertension, lipid, weight-loss medications, and med management appointments    Valet parking services will be available as well.

## 2023-10-07 ENCOUNTER — Ambulatory Visit (INDEPENDENT_AMBULATORY_CARE_PROVIDER_SITE_OTHER): Admitting: Podiatry

## 2023-10-07 ENCOUNTER — Encounter: Payer: Self-pay | Admitting: Podiatry

## 2023-10-07 ENCOUNTER — Ambulatory Visit (INDEPENDENT_AMBULATORY_CARE_PROVIDER_SITE_OTHER)

## 2023-10-07 DIAGNOSIS — M778 Other enthesopathies, not elsewhere classified: Secondary | ICD-10-CM

## 2023-10-07 DIAGNOSIS — M7752 Other enthesopathy of left foot: Secondary | ICD-10-CM

## 2023-10-07 MED ORDER — MELOXICAM 15 MG PO TABS: 15.0000 mg | ORAL_TABLET | Freq: Every day | ORAL | 0 refills | Status: AC

## 2023-10-07 NOTE — Progress Notes (Signed)
  Subjective:  Patient ID: Emily Adkins, female    DOB: Aug 03, 1950,   MRN: [id number]  Chief Complaint  Patient presents with   Foot Pain    My right foot is sore and tender under the two toes next to my big toe down to my little toe. (2-5 met)    73 y.o. female presents for concern of right foot pain that has been ongoing for about a month. Relates not injury but suddenly started having pain in the ball of her foot around the second and third toes. Denies numbness burning or tingling. Relates very painful to walk. Denies any treatments   . Denies any other pedal complaints. Denies n/v/f/c.   Past Medical History:  Diagnosis Date   Allergy    Anemia    in past   Blood transfusion without reported diagnosis 2011   bleeding related to cervical cancer surgery   Cancer [facility]) 2011   cervical cancer   Hyperlipidemia    Hypertension     Objective:  Physical Exam: Vascular: DP/PT pulses 2/4 bilateral. CFT <3 seconds. Normal hair growth on digits. No edema.  Skin. No lacerations or abrasions bilateral feet.  Musculoskeletal: MMT 5/5 bilateral lower extremities in DF, PF, Inversion and Eversion. Deceased ROM in DF of ankle joint. Tender to second and third metartarsal phalnagea joints bilateral. Some pain in second interspace on right. No pain with metatarsal squeeze. Tender with ROM of second and third MPJs more so with second.  Neurological: Sensation intact to light touch.   Assessment:   1. Capsulitis of foot      Plan:  Patient was evaluated and treated and all questions answered. X-rays reviewed and discussed with patient. No acute fractures or dislocations noted. Mild joint space narrowing and spurring noted at first MPJ on right. Mild pes planus noted.  Discussed capsulitis and inflammation of joint and treatment options with patient.  Radiographs reviewed and discussed with patient.  Injection deferred today.  Meloxicam  prescribed. Reviewed previous kidney function labs and  no issues in past.  Discussed stiff sole shoes and recommend use of metatarsal padding.  Discussed if pain does not improve may consider injection PT and/or MRI for further surgical planning.  Patient to return in 6 weeks or sooner if concerns arise.    Asberry Failing, DPM

## 2023-11-18 ENCOUNTER — Ambulatory Visit: Admitting: Podiatry

## 2024-02-12 ENCOUNTER — Other Ambulatory Visit: Payer: Self-pay | Admitting: Registered Nurse

## 2024-02-12 ENCOUNTER — Ambulatory Visit
Admission: RE | Admit: 2024-02-12 | Discharge: 2024-02-12 | Disposition: A | Source: Ambulatory Visit | Attending: Registered Nurse | Admitting: Registered Nurse

## 2024-02-12 DIAGNOSIS — R10A2 Flank pain, left side: Secondary | ICD-10-CM

## 2024-02-12 DIAGNOSIS — R3129 Other microscopic hematuria: Secondary | ICD-10-CM

## 2024-06-08 ENCOUNTER — Ambulatory Visit: Admitting: Cardiology
